# Patient Record
Sex: Female | Born: 1985 | Race: White | Hispanic: No | Marital: Married | Smoking: Former smoker
Health system: Southern US, Community
[De-identification: ages and names within clinical notes are randomized; demographics above are authoritative.]

## PROBLEM LIST (undated history)

## (undated) ENCOUNTER — Inpatient Hospital Stay (HOSPITAL_COMMUNITY): Payer: Self-pay

## (undated) DIAGNOSIS — F419 Anxiety disorder, unspecified: Secondary | ICD-10-CM

## (undated) DIAGNOSIS — Z9089 Acquired absence of other organs: Secondary | ICD-10-CM

## (undated) DIAGNOSIS — R112 Nausea with vomiting, unspecified: Secondary | ICD-10-CM

## (undated) DIAGNOSIS — Z8659 Personal history of other mental and behavioral disorders: Secondary | ICD-10-CM

## (undated) DIAGNOSIS — F329 Major depressive disorder, single episode, unspecified: Secondary | ICD-10-CM

## (undated) DIAGNOSIS — Z87891 Personal history of nicotine dependence: Secondary | ICD-10-CM

## (undated) DIAGNOSIS — D649 Anemia, unspecified: Secondary | ICD-10-CM

## (undated) DIAGNOSIS — E039 Hypothyroidism, unspecified: Secondary | ICD-10-CM

## (undated) DIAGNOSIS — O26899 Other specified pregnancy related conditions, unspecified trimester: Secondary | ICD-10-CM

## (undated) DIAGNOSIS — F319 Bipolar disorder, unspecified: Secondary | ICD-10-CM

## (undated) DIAGNOSIS — R102 Pelvic and perineal pain: Secondary | ICD-10-CM

## (undated) DIAGNOSIS — J189 Pneumonia, unspecified organism: Secondary | ICD-10-CM

## (undated) DIAGNOSIS — Z8279 Family history of other congenital malformations, deformations and chromosomal abnormalities: Secondary | ICD-10-CM

## (undated) DIAGNOSIS — K589 Irritable bowel syndrome without diarrhea: Secondary | ICD-10-CM

## (undated) DIAGNOSIS — IMO0002 Reserved for concepts with insufficient information to code with codable children: Secondary | ICD-10-CM

## (undated) DIAGNOSIS — F32A Depression, unspecified: Secondary | ICD-10-CM

## (undated) DIAGNOSIS — J45909 Unspecified asthma, uncomplicated: Secondary | ICD-10-CM

## (undated) DIAGNOSIS — Z9889 Other specified postprocedural states: Secondary | ICD-10-CM

## (undated) HISTORY — PX: TONSILLECTOMY: SUR1361

## (undated) HISTORY — PX: WISDOM TOOTH EXTRACTION: SHX21

## (undated) HISTORY — PX: ADENOIDECTOMY: SUR15

## (undated) HISTORY — DX: Depression, unspecified: F32.A

## (undated) HISTORY — DX: Major depressive disorder, single episode, unspecified: F32.9

---

## 2013-07-27 ENCOUNTER — Emergency Department (HOSPITAL_BASED_OUTPATIENT_CLINIC_OR_DEPARTMENT_OTHER)
Admission: EM | Admit: 2013-07-27 | Discharge: 2013-07-27 | Disposition: A | Discharge status: Home / Self Care | Payer: BC Managed Care – PPO | Attending: Emergency Medicine | Admitting: Emergency Medicine

## 2013-07-27 ENCOUNTER — Encounter (HOSPITAL_BASED_OUTPATIENT_CLINIC_OR_DEPARTMENT_OTHER): Payer: Self-pay | Admitting: Emergency Medicine

## 2013-07-27 ENCOUNTER — Emergency Department (HOSPITAL_BASED_OUTPATIENT_CLINIC_OR_DEPARTMENT_OTHER): Payer: BC Managed Care – PPO

## 2013-07-27 DIAGNOSIS — R51 Headache: Secondary | ICD-10-CM | POA: Insufficient documentation

## 2013-07-27 DIAGNOSIS — Z3202 Encounter for pregnancy test, result negative: Secondary | ICD-10-CM | POA: Insufficient documentation

## 2013-07-27 DIAGNOSIS — Z79899 Other long term (current) drug therapy: Secondary | ICD-10-CM | POA: Insufficient documentation

## 2013-07-27 DIAGNOSIS — R55 Syncope and collapse: Secondary | ICD-10-CM

## 2013-07-27 DIAGNOSIS — F17200 Nicotine dependence, unspecified, uncomplicated: Secondary | ICD-10-CM | POA: Insufficient documentation

## 2013-07-27 DIAGNOSIS — R110 Nausea: Secondary | ICD-10-CM | POA: Insufficient documentation

## 2013-07-27 DIAGNOSIS — R197 Diarrhea, unspecified: Secondary | ICD-10-CM | POA: Insufficient documentation

## 2013-07-27 DIAGNOSIS — H9209 Otalgia, unspecified ear: Secondary | ICD-10-CM | POA: Insufficient documentation

## 2013-07-27 DIAGNOSIS — F319 Bipolar disorder, unspecified: Secondary | ICD-10-CM | POA: Insufficient documentation

## 2013-07-27 HISTORY — DX: Bipolar disorder, unspecified: F31.9

## 2013-07-27 LAB — CBC WITH DIFFERENTIAL/PLATELET
Eosinophils Absolute: 0.1 10*3/uL (ref 0.0–0.7)
Eosinophils Relative: 1 % (ref 0–5)
Hemoglobin: 12.6 g/dL (ref 12.0–15.0)
Lymphs Abs: 3.6 10*3/uL (ref 0.7–4.0)
MCH: 30.4 pg (ref 26.0–34.0)
MCV: 90.6 fL (ref 78.0–100.0)
Monocytes Absolute: 1 10*3/uL (ref 0.1–1.0)
Monocytes Relative: 9 % (ref 3–12)
Platelets: 312 10*3/uL (ref 150–400)
RBC: 4.14 MIL/uL (ref 3.87–5.11)
RDW: 12.8 % (ref 11.5–15.5)

## 2013-07-27 LAB — BASIC METABOLIC PANEL
BUN: 10 mg/dL (ref 6–23)
Calcium: 9.8 mg/dL (ref 8.4–10.5)
Creatinine, Ser: 0.7 mg/dL (ref 0.50–1.10)
GFR calc non Af Amer: >90 mL/min (ref >90)
Glucose, Bld: 111 mg/dL — ABNORMAL HIGH (ref 70–99)
Sodium: 139 mEq/L (ref 135–145)

## 2013-07-27 LAB — URINALYSIS, ROUTINE W REFLEX MICROSCOPIC
Bilirubin Urine: NEGATIVE
Ketones, ur: NEGATIVE mg/dL
Nitrite: NEGATIVE
pH: 7 (ref 5.0–8.0)

## 2013-07-27 MED ORDER — SODIUM CHLORIDE 0.9 % IV SOLN
1000.0000 mL | Freq: Once | INTRAVENOUS | Status: AC
  Administered 2013-07-27: 1000 mL via INTRAVENOUS

## 2013-07-27 MED ORDER — SODIUM CHLORIDE 0.9 % IV SOLN
1000.0000 mL | INTRAVENOUS | Status: DC
  Administered 2013-07-27: 1000 mL via INTRAVENOUS

## 2013-07-27 NOTE — ED Provider Notes (Signed)
CSN: [id number]     Arrival date & time 07/27/13  4098 History  This chart was scribed for Celene Kras, MD by Danella Maiers, ED Scribe. This patient was seen in room MH02/MH02 and the patient's care was started at 6:48 PM.    Chief Complaint  Patient presents with  . Near Syncope   The history is provided by the patient. No language interpreter was used.   HPI Comments: Karen Yates is a 27 y.o. female who presents to the Emergency Department complaining of multiple episodes of syncope intermittently since 4pm today. She states she is able to stop LOC by sitting down, but every time she tries to stand up it brings the syncope on. She states one episode she was out for 60 seconds. She states when she comes to she is very emotional and tearful. She reports nausea, diarrhea. She has had a headache the past four days, taking ibuprofen with no relief. She has a h/o headaches and is usually able to control with ibuprofen. She also has bruises to bilateral upper arms. She states she had ear pain two weeks ago. She denies hitting her head, injuries. She denies blood in her stool. She is on Mirena and does not get periods.  Past Medical History  Diagnosis Date  . Bipolar 1 disorder    Past Surgical History  Procedure Laterality Date  . Cesarean section    . Tonsillectomy     No family history on file. History  Substance Use Topics  . Smoking status: Current Some Day Smoker  . Smokeless tobacco: Not on file  . Alcohol Use: Yes   OB History   Grav Para Term Preterm Abortions TAB SAB Ect Mult Living                 Review of Systems  Gastrointestinal: Positive for nausea and diarrhea. Negative for vomiting and blood in stool.  Neurological: Positive for syncope and headaches.  All other systems reviewed and are negative.   A complete 10 system review of systems was obtained and all systems are negative except as noted in the HPI and PMH.   Allergies  Review of patient's allergies  indicates no known allergies.  Home Medications   Current Outpatient Rx  Name  Route  Sig  Dispense  Refill  . BusPIRone HCl (BUSPAR PO)   Oral   Take by mouth.         . DULoxetine (CYMBALTA) 60 MG capsule   Oral   Take 60 mg by mouth daily.         . Topiramate (TOPAMAX PO)   Oral   Take by mouth.          BP 125/77  Pulse 88  Temp(Src) 98.6 F (37 C) (Oral)  Resp 18  Ht 5\' 6"  (1.676 m)  Wt 200 lb (90.719 kg)  BMI 32.30 kg/m2  SpO2 100% Physical Exam  Nursing note and vitals reviewed. Constitutional: She is oriented to person, place, and time. She appears well-developed and well-nourished. No distress.  HENT:  Head: Normocephalic and atraumatic.  Right Ear: External ear normal.  Left Ear: External ear normal.  Mouth/Throat: Oropharynx is clear and moist.  Normal TMs  Eyes: Conjunctivae are normal. Right eye exhibits no discharge. Left eye exhibits no discharge. No scleral icterus.  Neck: Neck supple. No tracheal deviation present.  Cardiovascular: Normal rate, regular rhythm and intact distal pulses.   Pulmonary/Chest: Effort normal and breath sounds normal. No stridor.  No respiratory distress. She has no wheezes. She has no rales.  Abdominal: Soft. Bowel sounds are normal. She exhibits no distension. There is no tenderness. There is no rebound and no guarding.  Musculoskeletal: She exhibits no edema and no tenderness.  No C-, T-, or L-spine tenderness  Neurological: She is alert and oriented to person, place, and time. She has normal strength. No cranial nerve deficit (No facial droop, extraocular movements intact, tongue midline ) or sensory deficit. She exhibits normal muscle tone. She displays no seizure activity. Coordination normal.  No pronator drift bilateral upper extrem, able to hold both legs off bed for 5 seconds, sensation intact in all extremities, no visual field cuts, no left or right sided neglect, normal finger-nose exam bilaterally, no nystagmus  noted   Skin: Skin is warm and dry. No rash noted.  Psychiatric: She has a normal mood and affect.    ED Course  Procedures (including critical care time) Medications  0.9 %  sodium chloride infusion (1,000 mLs Intravenous New Bag/Given 07/27/13 1947)    Followed by  0.9 %  sodium chloride infusion (1,000 mLs Intravenous New Bag/Given 07/27/13 1948)    Followed by  0.9 %  sodium chloride infusion (1,000 mLs Intravenous New Bag/Given 07/27/13 1948)    DIAGNOSTIC STUDIES: Oxygen Saturation is 100% on RA, normal by my interpretation.    COORDINATION OF CARE: 6:57 PM- Discussed treatment plan with pt which includes bloodwork, UA. Pt agrees to plan.    Labs Review Labs Reviewed  CBC WITH DIFFERENTIAL - Abnormal; Notable for the following:    WBC 12.1 (*)    All other components within normal limits  BASIC METABOLIC PANEL - Abnormal; Notable for the following:    Glucose, Bld 111 (*)    All other components within normal limits  URINALYSIS, ROUTINE W REFLEX MICROSCOPIC  PREGNANCY, URINE   Imaging Review Ct Head Wo Contrast  07/27/2013   CLINICAL DATA:  Dizziness.  Headache.  EXAM: CT HEAD WITHOUT CONTRAST  TECHNIQUE: Contiguous axial images were obtained from the base of the skull through the vertex without intravenous contrast.  COMPARISON:  None.  FINDINGS: Normal appearing cerebral hemispheres and posterior fossa structures. Normal size and position of the ventricles. No intracranial hemorrhage, mass lesion or CT evidence of acute infarction. Unremarkable bones and included paranasal sinuses.  IMPRESSION: Normal examination.   Electronically Signed   By: Gordan Payment M.D.   On: 07/27/2013 19:40    EKG Interpretation     Ventricular Rate:  77 PR Interval:  106 QRS Duration: 80 QT Interval:  364 QTC Calculation: 411 R Axis:   40 Text Interpretation:  Sinus rhythm with short PR no delta wave Abnormal ECG No previous tracing            MDM   1. Syncope    EKG  not suggestive of WPW.  Likely vasovagal.  Pt has been able to walk to the bathroom in the ED without difficulty.  She mentions history of IBS and she has had some diarrhea.  She may have been dehydrated.    At this time there does not appear to be any evidence of an acute emergency medical condition and the patient appears stable for discharge with appropriate outpatient follow up.      I personally performed the services described in this documentation, which was scribed in my presence.  The recorded information has been reviewed and is accurate.   Celene Kras, MD 07/27/13 2049

## 2013-07-27 NOTE — ED Notes (Signed)
Dizziness since this afternoon. States she has been passing out everytime she tries to walk around. States when she wakes up she is real emotional and crying. Headache x 4 days. Alert oriented.

## 2013-08-12 ENCOUNTER — Emergency Department (HOSPITAL_BASED_OUTPATIENT_CLINIC_OR_DEPARTMENT_OTHER)
Admission: EM | Admit: 2013-08-12 | Discharge: 2013-08-12 | Disposition: A | Discharge status: Home / Self Care | Payer: BC Managed Care – PPO | Attending: Emergency Medicine | Admitting: Emergency Medicine

## 2013-08-12 ENCOUNTER — Encounter (HOSPITAL_BASED_OUTPATIENT_CLINIC_OR_DEPARTMENT_OTHER): Payer: Self-pay | Admitting: Emergency Medicine

## 2013-08-12 DIAGNOSIS — Z79899 Other long term (current) drug therapy: Secondary | ICD-10-CM | POA: Insufficient documentation

## 2013-08-12 DIAGNOSIS — F319 Bipolar disorder, unspecified: Secondary | ICD-10-CM | POA: Insufficient documentation

## 2013-08-12 DIAGNOSIS — T360X5A Adverse effect of penicillins, initial encounter: Secondary | ICD-10-CM | POA: Insufficient documentation

## 2013-08-12 DIAGNOSIS — T7840XA Allergy, unspecified, initial encounter: Secondary | ICD-10-CM

## 2013-08-12 DIAGNOSIS — L509 Urticaria, unspecified: Secondary | ICD-10-CM

## 2013-08-12 DIAGNOSIS — F17200 Nicotine dependence, unspecified, uncomplicated: Secondary | ICD-10-CM | POA: Insufficient documentation

## 2013-08-12 MED ORDER — EPINEPHRINE 0.3 MG/0.3ML IJ SOAJ
0.3000 mg | Freq: Once | INTRAMUSCULAR | Status: DC
  Filled 2013-08-12: qty 0.6

## 2013-08-12 MED ORDER — PREDNISONE 20 MG PO TABS: ORAL_TABLET | ORAL | Status: DC

## 2013-08-12 MED ORDER — FAMOTIDINE 20 MG PO TABS: 20.0000 mg | ORAL_TABLET | Freq: Two times a day (BID) | ORAL | Status: DC

## 2013-08-12 MED ORDER — FAMOTIDINE 20 MG PO TABS
20.0000 mg | ORAL_TABLET | Freq: Once | ORAL | Status: AC
  Administered 2013-08-12: 20 mg via ORAL
  Filled 2013-08-12: qty 1

## 2013-08-12 MED ORDER — DIPHENHYDRAMINE HCL 25 MG PO TABS: 50.0000 mg | ORAL_TABLET | ORAL | Status: DC | PRN

## 2013-08-12 MED ORDER — PREDNISONE 10 MG PO TABS
60.0000 mg | ORAL_TABLET | Freq: Once | ORAL | Status: AC
  Administered 2013-08-12: 60 mg via ORAL
  Filled 2013-08-12 (×2): qty 1

## 2013-08-12 MED ORDER — EPINEPHRINE HCL 1 MG/ML IJ SOLN
INTRAMUSCULAR | Status: AC
  Administered 2013-08-12: 0.3 mg
  Filled 2013-08-12: qty 1

## 2013-08-12 MED ORDER — DIPHENHYDRAMINE HCL 50 MG/ML IJ SOLN
50.0000 mg | Freq: Once | INTRAMUSCULAR | Status: AC
  Administered 2013-08-12: 50 mg via INTRAMUSCULAR
  Filled 2013-08-12: qty 1

## 2013-08-12 MED ORDER — LORATADINE 10 MG PO TABS: 10.0000 mg | ORAL_TABLET | Freq: Every day | ORAL | Status: DC

## 2013-08-12 NOTE — ED Provider Notes (Signed)
CSN: [id number]     Arrival date & time 08/12/13  1032 History   First MD Initiated Contact with Patient 08/12/13 1053     Chief Complaint  Patient presents with  . allergic reaction    (Consider location/radiation/quality/duration/timing/severity/associated sxs/prior Treatment) HPI This 27 year old female presents with a rash, she has had about a week or so of some nasal congestion and was started on amoxicillin by an urgent care, yesterday she had mild itching and redness to her thighs but today has developed generalized hives with generalized itching with no involvement of her eyes mouth her vaginal region, she is no tongue swelling no lip swelling no shortness of breath no chest pain no vomiting no abdominal pain no lightheadedness no confusion, treatment prior to arrival consisted of Benadryl orally just prior to arrival. She is no bleeding or blistering or bruising rash. Her itching and rash are moderately severe. Past Medical History  Diagnosis Date  . Bipolar 1 disorder    Past Surgical History  Procedure Laterality Date  . Cesarean section    . Tonsillectomy     History reviewed. No pertinent family history. History  Substance Use Topics  . Smoking status: Current Some Day Smoker  . Smokeless tobacco: Not on file  . Alcohol Use: Yes   OB History   Grav Para Term Preterm Abortions TAB SAB Ect Mult Living                 Review of Systems 10 Systems reviewed and are negative for acute change except as noted in the HPI. Allergies  Review of patient's allergies indicates no known allergies.  Home Medications   Current Outpatient Rx  Name  Route  Sig  Dispense  Refill  . BusPIRone HCl (BUSPAR PO)   Oral   Take by mouth.         . diphenhydrAMINE (BENADRYL) 25 MG tablet   Oral   Take 2 tablets (50 mg total) by mouth every 4 (four) hours as needed for itching.   20 tablet   0   . DULoxetine (CYMBALTA) 60 MG capsule   Oral   Take 60 mg by mouth daily.         . famotidine (PEPCID) 20 MG tablet   Oral   Take 1 tablet (20 mg total) by mouth 2 (two) times daily.   10 tablet   0   . loratadine (CLARITIN) 10 MG tablet   Oral   Take 1 tablet (10 mg total) by mouth daily. One po daily x 5 days   5 tablet   0   . predniSONE (DELTASONE) 20 MG tablet      2 tabs po daily x 4 days   8 tablet   0   . Topiramate (TOPAMAX PO)   Oral   Take by mouth.          BP 115/66  Pulse 82  Temp(Src) 98.6 F (37 C) (Oral)  Resp 18  SpO2 100% Physical Exam  Nursing note and vitals reviewed. Constitutional:  Awake, alert, nontoxic appearance.  HENT:  Head: Atraumatic.  Tongue appears normal  Eyes: Right eye exhibits no discharge. Left eye exhibits no discharge.  Neck: Neck supple.  Cardiovascular: Normal rate and regular rhythm.   No murmur heard. Pulmonary/Chest: Effort normal and breath sounds normal. No respiratory distress. She has no wheezes. She has no rales. She exhibits no tenderness.  Abdominal: Soft. There is no tenderness. There is no rebound.  Musculoskeletal:  She exhibits no tenderness.  Baseline ROM, no obvious new focal weakness.  Neurological: She is alert.  Mental status and motor strength appears baseline for patient and situation.  Skin: Rash noted.  Diffuse urticarial rash on trunk and all 4 extremities with erythematous plaques no petechiae no purpura and no tenderness no vesicles  Psychiatric: She has a normal mood and affect.    ED Course  Procedures (including critical care time) Pt feels improved after observation and/or treatment in ED.Patient / Family / Caregiver informed of clinical course, understand medical decision-making process, and agree with plan. Labs Review Labs Reviewed - No data to display Imaging Review No results found.  EKG Interpretation   None       MDM   1. Hives   2. Allergic reaction caused by a drug    I doubt any other EMC precluding discharge at this time including, but not  necessarily limited to the following:anaphylaxis.    Hurman Horn, MD 08/12/13 2045

## 2013-08-12 NOTE — ED Notes (Signed)
Yesterday had red itchy and burning rash on her legs today has spread to chest hands and trunk. Pt denies using any thing new but does report being on amoxicillin x one week for a sinus infection

## 2013-12-04 ENCOUNTER — Emergency Department (HOSPITAL_BASED_OUTPATIENT_CLINIC_OR_DEPARTMENT_OTHER): Payer: BC Managed Care – PPO

## 2013-12-04 ENCOUNTER — Emergency Department (HOSPITAL_BASED_OUTPATIENT_CLINIC_OR_DEPARTMENT_OTHER)
Admission: EM | Admit: 2013-12-04 | Discharge: 2013-12-04 | Disposition: A | Discharge status: Home / Self Care | Payer: BC Managed Care – PPO | Attending: Emergency Medicine | Admitting: Emergency Medicine

## 2013-12-04 ENCOUNTER — Encounter (HOSPITAL_BASED_OUTPATIENT_CLINIC_OR_DEPARTMENT_OTHER): Payer: Self-pay | Admitting: Emergency Medicine

## 2013-12-04 DIAGNOSIS — K5900 Constipation, unspecified: Secondary | ICD-10-CM | POA: Insufficient documentation

## 2013-12-04 DIAGNOSIS — F319 Bipolar disorder, unspecified: Secondary | ICD-10-CM | POA: Insufficient documentation

## 2013-12-04 DIAGNOSIS — Z79899 Other long term (current) drug therapy: Secondary | ICD-10-CM | POA: Insufficient documentation

## 2013-12-04 DIAGNOSIS — F17200 Nicotine dependence, unspecified, uncomplicated: Secondary | ICD-10-CM | POA: Insufficient documentation

## 2013-12-04 DIAGNOSIS — Z880 Allergy status to penicillin: Secondary | ICD-10-CM | POA: Insufficient documentation

## 2013-12-04 HISTORY — DX: Irritable bowel syndrome, unspecified: K58.9

## 2013-12-04 MED ORDER — DISPOSABLE ENEMA 19-7 GM/118ML RE ENEM: 1.0000 | ENEMA | Freq: Once | RECTAL | Status: DC

## 2013-12-04 MED ORDER — MAGNESIUM CITRATE PO SOLN: 1.0000 | Freq: Once | ORAL | Status: DC

## 2013-12-04 NOTE — Discharge Instructions (Signed)
Constipation, Adult °Constipation is when a person: °· Poops (bowel movement) less than 3 times a week. °· Has a hard time pooping. °· Has poop that is dry, hard, or bigger than normal. °HOME CARE  °· Eat more fiber, such as fruits, vegetables, whole grains like brown rice, and beans. °· Eat less fatty foods and sugar. This includes French fries, hamburgers, cookies, candy, and soda. °· If you are not getting enough fiber from food, take products with added fiber in them (supplements). °· Drink enough fluid to keep your pee (urine) clear or pale yellow. °· Go to the restroom when you feel like you need to poop. Do not hold it. °· Only take medicine as told by your doctor. Do not take medicines that help you poop (laxatives) without talking to your doctor first. °· Exercise on a regular basis, or as told by your doctor. °GET HELP RIGHT AWAY IF:  °· You have bright red blood in your poop (stool). °· Your constipation lasts more than 4 days or gets worse. °· You have belly (abdomen) or butt (rectal) pain. °· You have thin poop (as thin as a pencil). °· You lose weight, and it cannot be explained. °MAKE SURE YOU:  °· Understand these instructions. °· Will watch your condition. °· Will get help right away if you are not doing well or get worse. °Document Released: 02/19/2008 Document Revised: 11/25/2011 Document Reviewed: 06/14/2013 °Exit[employer]® Patient Information ©2014 [employer]. ° °Fiber Content in Foods °Drinking plenty of fluids and consuming foods high in fiber can help with constipation. See the list below for the fiber content of some common foods. °Starches and Grains / Dietary Fiber (g) °· Cheerios, 1 cup / 3 g °· Kellogg's Corn Flakes, 1 cup / 0.7 g °· Rice Krispies, 1 ¼ cup / 0.3 g °· Quaker Oat Life Cereal, ¾ cup / 2.1 g °· Oatmeal, instant (cooked), ½ cup / 2 g °· Kell[employer]'s Frosted Mini Wheats, 1 cup / 5.1 g °· Rice, brown, long-grain (cooked), 1 cup / 3.5 g °· Rice, white, long-grain (cooked), 1 cup /  0.6 g °· Macaroni, cooked, enriched, 1 cup / 2.5 g °Legumes / Dietary Fiber (g) °· Beans, baked, canned, plain or vegetarian, ½ cup / 5.2 g °· Beans, kidney, canned, ½ cup / 6.8 g °· Beans, pinto, dried (cooked), ½ cup / 7.7 g °· Beans, pinto, canned, ½ cup / 5.5 g °Breads and Crackers / Dietary Fiber (g) °· Graham crackers, plain or honey, 2 squares / 0.7 g °· Saltine crackers, 3 squares / 0.3 g °· Pretzels, plain, salted, 10 pieces / 1.8 g °· Bread, whole-wheat, 1 slice / 1.9 g °· Bread, white, 1 slice / 0.7 g °· Bread, raisin, 1 slice / 1.2 g °· Bagel, plain, 3 oz / 2 g °· Tortilla, flour, 1 oz / 0.9 g °· Tortilla, corn, 1 small / 1.5 g °· Bun, hamburger or hotdog, 1 small / 0.9 g °Fruits / Dietary Fiber (g) °· Apple, raw with skin, 1 medium / 4.4 g °· Applesauce, sweetened, ½ cup / 1.5 g °· Banana, ½ medium / 1.5 g °· Grapes, 10 grapes / 0.4 g °· Orange, 1 small / 2.3 g °· Raisin, 1.5 oz / 1.6 g °· Melon, 1 cup / 1.4 g °Vegetables / Dietary Fiber (g) °· Green beans, canned, ½ cup / 1.3 g °· Carrots (cooked), ½ cup / 2.3 g °· Broccoli (cooked), ½ cup / 2.8 g °· Peas,   frozen (cooked), ½ cup / 4.4 g °· Potatoes, mashed, ½ cup / 1.6 g °· Lettuce, 1 cup / 0.5 g °· Corn, canned, ½ cup / 1.6 g °· Tomato, ½ cup / 1.1 g °Document Released: 01/19/2007 Document Revised: 11/25/2011 Document Reviewed: 03/16/2007 °ExitCare® Patient Information ©2014 [employer]. ° °

## 2013-12-04 NOTE — ED Notes (Signed)
Dr Rosalia Hammersay at bedside to perform rectal exam with Jazmarie Biever RN as chaperone

## 2013-12-04 NOTE — ED Notes (Signed)
Reports having IBS-diarrhea 8 days ago, no BM since.  Passing mucus every two days or so.  Reports abdominal distention, decreased appetite.  Nausea x two days, abdominal cramping.  Denies fever.

## 2013-12-04 NOTE — ED Notes (Signed)
MD at bedside. 

## 2013-12-04 NOTE — ED Provider Notes (Signed)
CSN: [id number]     Arrival date & time 12/04/13  1433 History   This chart was scribed for Karen Quarryanielle S Galaxy Borden, MD by Blanchard KelchNicole Curnes, ED Scribe. The patient was seen in room MH06/MH06. Patient's care was started at 4:04 PM.      Chief Complaint  Patient presents with  . Constipation    Patient is a 28 y.o. female presenting with constipation. The history is provided by the patient. No language interpreter was used.  Constipation Time since last bowel movement:  8 days Timing:  Constant Progression:  Unchanged Chronicity:  New Context: not dehydration, not dietary changes and not medication   Stool description:  None produced Relieved by:  Nothing Ineffective treatments:  Defecation, fiber and stool softeners Associated symptoms: no fever     HPI Comments: Karen Yates is a 28 y.o. female with a history of IBS who presents to the Emergency Department complaining of constant constipation for eight days. She states she has had one episode of diarrhea eight days ago and has not had a BM since. She states that she feels as if there is stool in the rectum but she is unable to push it out. She has associated abdominal distension as well as mucus and blood on the tissue paper when she wipes. She has been taking an OTC stool softener, drinking water, eating dairy and fiber without relief. She is on Cymbalta and Buspar for Bipolar disorder but has been on the medications for over a year. She denies a history of constipation.  Pt denies having a PCP or GI specialist currently.   Past Medical History  Diagnosis Date  . Bipolar 1 disorder   . IBS (irritable bowel syndrome)    Past Surgical History  Procedure Laterality Date  . Cesarean section    . Tonsillectomy     No family history on file. History  Substance Use Topics  . Smoking status: Current Some Day Smoker  . Smokeless tobacco: Not on file  . Alcohol Use: Yes   OB History   Grav Para Term Preterm Abortions TAB SAB Ect Mult Living                  Review of Systems  Constitutional: Negative for fever.  Gastrointestinal: Positive for constipation and abdominal distention.  All other systems reviewed and are negative.      Allergies  Amoxicillin  Home Medications   Current Outpatient Rx  Name  Route  Sig  Dispense  Refill  . BusPIRone HCl (BUSPAR PO)   Oral   Take by mouth.         . diphenhydrAMINE (BENADRYL) 25 MG tablet   Oral   Take 2 tablets (50 mg total) by mouth every 4 (four) hours as needed for itching.   20 tablet   0   . DULoxetine (CYMBALTA) 60 MG capsule   Oral   Take 60 mg by mouth daily.         . famotidine (PEPCID) 20 MG tablet   Oral   Take 1 tablet (20 mg total) by mouth 2 (two) times daily.   10 tablet   0   . loratadine (CLARITIN) 10 MG tablet   Oral   Take 1 tablet (10 mg total) by mouth daily. One po daily x 5 days   5 tablet   0   . predniSONE (DELTASONE) 20 MG tablet      2 tabs po daily x 4 days   8  tablet   0   . Topiramate (TOPAMAX PO)   Oral   Take by mouth.          Triage Vitals: BP 135/88  Pulse 113  Temp(Src) 98.1 F (36.7 C) (Oral)  Resp 16  SpO2 98%  Physical Exam  Nursing note and vitals reviewed. Constitutional: She is oriented to person, place, and time. She appears well-developed and well-nourished.  HENT:  Head: Normocephalic and atraumatic.  Eyes: Conjunctivae and EOM are normal. Pupils are equal, round, and reactive to light.  Neck: Normal range of motion. Neck supple.  Cardiovascular: Normal rate, regular rhythm and normal heart sounds.   Pulmonary/Chest: Effort normal and breath sounds normal. No respiratory distress. She has no wheezes. She has no rales. She exhibits no tenderness.  Abdominal: Soft. Bowel sounds are normal. There is no tenderness. There is no rebound and no guarding.  Musculoskeletal: Normal range of motion. She exhibits no edema.  Lymphadenopathy:    She has no cervical adenopathy.  Neurological: She  is alert and oriented to person, place, and time.  Skin: Skin is warm and dry. No rash noted.  Psychiatric: She has a normal mood and affect.    ED Course  Procedures (including critical care time)  DIAGNOSTIC STUDIES: Oxygen Saturation is 98% on room air, normal by my interpretation.    COORDINATION OF CARE: 4:10 PM - Patient verbalizes understanding and agrees with treatment plan.    Labs Review Labs Reviewed - No data to display Imaging Review Dg Abd 1 View  12/04/2013   CLINICAL DATA:  Diarrhea 8 days ago, no bowel movement since, abdominal distension, decreased appetite, nausea for 2 days, cramping, history irritable bowel syndrome  EXAM: ABDOMEN - 1 VIEW  COMPARISON:  None  FINDINGS: IUD projects over pelvis.  Tiny pelvic phleboliths.  No urinary tract calcification.  Normal bowel gas pattern with scattered gas within nondistended margin small bowel loops.  No bowel wall thickening or acute osseous findings.  IMPRESSION: Normal bowel gas pattern.   Electronically Signed   By: Ulyses Southward M.D.   On: 12/04/2013 16:51     EKG Interpretation None      MDM   Final diagnoses:  None   27 rolled female complaining of constipation. There are plain x-rays obtained that showed no evidence of obstruction. Rectal exam reveals no impaction. Plan Fleet Enema and stool softeners increase in fiber with followup with primary care physician. I personally performed the services described in this documentation, which was scribed in my presence. The recorded information has been reviewed and considered.    Karen Quarry, MD 12/04/13 [phone]

## 2013-12-29 ENCOUNTER — Emergency Department (HOSPITAL_BASED_OUTPATIENT_CLINIC_OR_DEPARTMENT_OTHER)
Admission: EM | Admit: 2013-12-29 | Discharge: 2013-12-29 | Disposition: A | Discharge status: Home / Self Care | Payer: BC Managed Care – PPO | Attending: Emergency Medicine | Admitting: Emergency Medicine

## 2013-12-29 ENCOUNTER — Encounter (HOSPITAL_BASED_OUTPATIENT_CLINIC_OR_DEPARTMENT_OTHER): Payer: Self-pay | Admitting: Emergency Medicine

## 2013-12-29 ENCOUNTER — Emergency Department (HOSPITAL_BASED_OUTPATIENT_CLINIC_OR_DEPARTMENT_OTHER): Payer: BC Managed Care – PPO

## 2013-12-29 DIAGNOSIS — R110 Nausea: Secondary | ICD-10-CM | POA: Insufficient documentation

## 2013-12-29 DIAGNOSIS — J40 Bronchitis, not specified as acute or chronic: Secondary | ICD-10-CM

## 2013-12-29 DIAGNOSIS — R5383 Other fatigue: Secondary | ICD-10-CM | POA: Insufficient documentation

## 2013-12-29 DIAGNOSIS — M549 Dorsalgia, unspecified: Secondary | ICD-10-CM | POA: Insufficient documentation

## 2013-12-29 DIAGNOSIS — Z79899 Other long term (current) drug therapy: Secondary | ICD-10-CM | POA: Insufficient documentation

## 2013-12-29 DIAGNOSIS — Z880 Allergy status to penicillin: Secondary | ICD-10-CM | POA: Insufficient documentation

## 2013-12-29 DIAGNOSIS — Z8719 Personal history of other diseases of the digestive system: Secondary | ICD-10-CM | POA: Insufficient documentation

## 2013-12-29 DIAGNOSIS — R42 Dizziness and giddiness: Secondary | ICD-10-CM | POA: Insufficient documentation

## 2013-12-29 DIAGNOSIS — Z87891 Personal history of nicotine dependence: Secondary | ICD-10-CM | POA: Insufficient documentation

## 2013-12-29 DIAGNOSIS — J3489 Other specified disorders of nose and nasal sinuses: Secondary | ICD-10-CM | POA: Insufficient documentation

## 2013-12-29 DIAGNOSIS — F319 Bipolar disorder, unspecified: Secondary | ICD-10-CM | POA: Insufficient documentation

## 2013-12-29 DIAGNOSIS — R000 Tachycardia, unspecified: Secondary | ICD-10-CM | POA: Insufficient documentation

## 2013-12-29 DIAGNOSIS — R5381 Other malaise: Secondary | ICD-10-CM | POA: Insufficient documentation

## 2013-12-29 DIAGNOSIS — J45901 Unspecified asthma with (acute) exacerbation: Secondary | ICD-10-CM | POA: Insufficient documentation

## 2013-12-29 LAB — D-DIMER, QUANTITATIVE (NOT AT ARMC): D-Dimer, Quant: 0.32 ug/mL-FEU (ref 0.00–0.48)

## 2013-12-29 MED ORDER — AZITHROMYCIN 250 MG PO TABS: ORAL_TABLET | ORAL | Status: DC

## 2013-12-29 MED ORDER — IOHEXOL 350 MG/ML SOLN
100.0000 mL | Freq: Once | INTRAVENOUS | Status: AC | PRN
  Administered 2013-12-29: 100 mL via INTRAVENOUS

## 2013-12-29 MED ORDER — PREDNISONE 20 MG PO TABS: ORAL_TABLET | ORAL | Status: DC

## 2013-12-29 MED ORDER — ALBUTEROL SULFATE HFA 108 (90 BASE) MCG/ACT IN AERS
2.0000 | INHALATION_SPRAY | Freq: Once | RESPIRATORY_TRACT | Status: AC
  Administered 2013-12-29: 2 via RESPIRATORY_TRACT
  Filled 2013-12-29: qty 6.7

## 2013-12-29 NOTE — ED Provider Notes (Signed)
CSN: [id number]     Arrival date & time 12/29/13  1523 History   First MD Initiated Contact with Patient 12/29/13 1552     Chief Complaint  Patient presents with  . URI     (Consider location/radiation/quality/duration/timing/severity/associated sxs/prior Treatment) HPI Comments: Pt presents with worsening cough and SOB.  She has had cough and cold symptoms for 2 weeks.  She states that the upper respiratory congestion is gotten better however the cough is worsening. It's mostly a dry cough. She's had some increased fatigue and some nausea. She has shortness of breath on exertion. She also has some increased pain in her her chest and across her back. This is worse with breathing and worse with coughing. She denies any leg pain or swelling. She denies any recent fevers or chills. She has a history of asthma as a child but hasn't used inhaler number of years. She quit smoking a few months ago.  Patient is a 28 y.o. female presenting with URI.  URI Presenting symptoms: congestion, cough, fatigue and rhinorrhea   Presenting symptoms: no fever   Associated symptoms: no arthralgias, no headaches and no sneezing     Past Medical History  Diagnosis Date  . Bipolar 1 disorder   . IBS (irritable bowel syndrome)    Past Surgical History  Procedure Laterality Date  . Cesarean section    . Tonsillectomy     No family history on file. History  Substance Use Topics  . Smoking status: Former Games developer  . Smokeless tobacco: Not on file  . Alcohol Use: Yes   OB History   Grav Para Term Preterm Abortions TAB SAB Ect Mult Living                 Review of Systems  Constitutional: Positive for fatigue. Negative for fever, chills and diaphoresis.  HENT: Positive for congestion and rhinorrhea. Negative for sneezing.   Eyes: Negative.   Respiratory: Positive for cough and shortness of breath. Negative for chest tightness.   Cardiovascular: Positive for chest pain. Negative for leg swelling.   Gastrointestinal: Positive for nausea. Negative for vomiting, abdominal pain, diarrhea and blood in stool.  Genitourinary: Negative for frequency, hematuria, flank pain and difficulty urinating.  Musculoskeletal: Positive for back pain. Negative for arthralgias.  Skin: Negative for rash.  Neurological: Positive for light-headedness. Negative for dizziness, speech difficulty, weakness, numbness and headaches.      Allergies  Amoxicillin  Home Medications   Prior to Admission medications   Medication Sig Start Date End Date Taking? Authorizing Provider  BusPIRone HCl (BUSPAR PO) Take by mouth.    Historical Provider, MD  diphenhydrAMINE (BENADRYL) 25 MG tablet Take 2 tablets (50 mg total) by mouth every 4 (four) hours as needed for itching. 08/12/13   Hurman Horn, MD  DULoxetine (CYMBALTA) 60 MG capsule Take 60 mg by mouth daily.    Historical Provider, MD  famotidine (PEPCID) 20 MG tablet Take 1 tablet (20 mg total) by mouth 2 (two) times daily. 08/12/13   Hurman Horn, MD  loratadine (CLARITIN) 10 MG tablet Take 1 tablet (10 mg total) by mouth daily. One po daily x 5 days 08/12/13   Hurman Horn, MD  magnesium citrate SOLN Take 296 mLs (1 Bottle total) by mouth once. 12/04/13   Hilario Quarry, MD  predniSONE (DELTASONE) 20 MG tablet 2 tabs po daily x 4 days 08/12/13   Hurman Horn, MD  sodium phosphate (FLEET) enema Place 1 enema  rectally once. follow package directions 12/04/13   Hilario Quarryanielle S Ray, MD  Topiramate (TOPAMAX PO) Take by mouth.    Historical Provider, MD   BP 128/85  Pulse 86  Temp(Src) 98.2 F (36.8 C) (Oral)  Resp 18  Ht 5\' 6"  (1.676 m)  Wt 205 lb (92.987 kg)  BMI 33.10 kg/m2  SpO2 100% Physical Exam  Constitutional: She is oriented to person, place, and time. She appears well-developed and well-nourished.  HENT:  Head: Normocephalic and atraumatic.  Mouth/Throat: Oropharynx is clear and moist.  Eyes: Pupils are equal, round, and reactive to light.  Neck:  Normal range of motion. Neck supple.  Cardiovascular: Regular rhythm and normal heart sounds.  Tachycardia present.   Pulmonary/Chest: Effort normal and breath sounds normal. No respiratory distress. She has no wheezes. She has no rales. She exhibits no tenderness.  Abdominal: Soft. Bowel sounds are normal. There is no tenderness. There is no rebound and no guarding.  Musculoskeletal: Normal range of motion. She exhibits no edema.  No calf tenderness  Lymphadenopathy:    She has no cervical adenopathy.  Neurological: She is alert and oriented to person, place, and time.  Skin: Skin is warm and dry. No rash noted.  Psychiatric: She has a normal mood and affect.    ED Course  Procedures (including critical care time) Labs Review Labs Reviewed  D-DIMER, QUANTITATIVE    Imaging Review Dg Chest 2 View  12/29/2013   CLINICAL DATA:  Chest congestion  EXAM: CHEST  2 VIEW  COMPARISON:  None.  FINDINGS: Cardiomediastinal silhouette is unremarkable. No acute infiltrate or pleural effusion. No pulmonary edema. Bony thorax is unremarkable.  IMPRESSION: No active cardiopulmonary disease.   Electronically Signed   By: Natasha MeadLiviu  Pop M.D.   On: 12/29/2013 16:58   Ct Angio Chest Pe W/cm &/or Wo Cm  12/29/2013   CLINICAL DATA:  Chest pain and shortness of breath.  EXAM: CT ANGIOGRAPHY CHEST WITH CONTRAST  TECHNIQUE: Multidetector CT imaging of the chest was performed using the standard protocol during bolus administration of intravenous contrast. Multiplanar CT image reconstructions and MIPs were obtained to evaluate the vascular anatomy.  CONTRAST:  100mL OMNIPAQUE IOHEXOL 350 MG/ML SOLN  COMPARISON:  DG CHEST 2 VIEW dated 12/29/2013  FINDINGS: The pulmonary arteries are very well opacified. There is no evidence of pulmonary embolism. The thoracic aorta is also well opacified and shows normal caliber and patency.  The mediastinum is normal. The heart size is within normal limits. No pleural or pericardial fluid  is identified. Lungs show no evidence of infiltrates, pneumothorax, nodule or edema.  Visualized airways are normally patent. No evidence of lymphadenopathy. Visualized upper abdomen is unremarkable. Bony structures are within normal limits.  Review of the MIP images confirms the above findings.  IMPRESSION: Normal CTA of the chest.   Electronically Signed   By: Irish LackGlenn  Yamagata M.D.   On: 12/29/2013 18:17     EKG Interpretation None      MDM   Final diagnoses:  Bronchitis    Patient presents with a two-week history of cough and cold symptoms. Her oxygen saturations are normal but she was persistently tachycardic. Her chest x-ray was negative and given her persistent tachycardia with other symptoms, I did do a CT chest which was negative for PE or pneumonia. I feel that she likely has bronchitis. Since her symptoms have been worsening I did go ahead and give her prescription for Zithromax as well as prednisone. She did not have  a primary care physician. I encouraged her to either obtain a primary care sister for followup or return here as needed if she has no improvement of symptoms or any worsening symptoms.    Rolan BuccoMelanie Thaila Bottoms, MD 12/29/13 [phone]

## 2013-12-29 NOTE — ED Notes (Signed)
C/o head and chest congestion-nonprod cough x 4 days

## 2013-12-29 NOTE — Discharge Instructions (Signed)

## 2013-12-29 NOTE — ED Notes (Signed)
Family at bedside. 

## 2013-12-29 NOTE — ED Notes (Signed)
Patient ambulated in hall with pulse-ox on room air.  HR 118-132, RR 16-20, no DOE noted. SpO2 99-100%, quick steady pace.

## 2014-01-08 ENCOUNTER — Emergency Department (HOSPITAL_COMMUNITY)
Admission: EM | Admit: 2014-01-08 | Discharge: 2014-01-08 | Disposition: A | Discharge status: Home / Self Care | Payer: BC Managed Care – PPO | Attending: Emergency Medicine | Admitting: Emergency Medicine

## 2014-01-08 ENCOUNTER — Encounter (HOSPITAL_COMMUNITY): Payer: Self-pay | Admitting: Emergency Medicine

## 2014-01-08 ENCOUNTER — Emergency Department (HOSPITAL_COMMUNITY): Payer: BC Managed Care – PPO

## 2014-01-08 DIAGNOSIS — Z79899 Other long term (current) drug therapy: Secondary | ICD-10-CM | POA: Insufficient documentation

## 2014-01-08 DIAGNOSIS — G510 Bell's palsy: Secondary | ICD-10-CM | POA: Insufficient documentation

## 2014-01-08 DIAGNOSIS — Z880 Allergy status to penicillin: Secondary | ICD-10-CM | POA: Insufficient documentation

## 2014-01-08 DIAGNOSIS — Z8709 Personal history of other diseases of the respiratory system: Secondary | ICD-10-CM | POA: Insufficient documentation

## 2014-01-08 DIAGNOSIS — Z87891 Personal history of nicotine dependence: Secondary | ICD-10-CM | POA: Insufficient documentation

## 2014-01-08 DIAGNOSIS — IMO0002 Reserved for concepts with insufficient information to code with codable children: Secondary | ICD-10-CM | POA: Insufficient documentation

## 2014-01-08 DIAGNOSIS — Z8719 Personal history of other diseases of the digestive system: Secondary | ICD-10-CM | POA: Insufficient documentation

## 2014-01-08 DIAGNOSIS — F319 Bipolar disorder, unspecified: Secondary | ICD-10-CM | POA: Insufficient documentation

## 2014-01-08 DIAGNOSIS — F411 Generalized anxiety disorder: Secondary | ICD-10-CM | POA: Insufficient documentation

## 2014-01-08 DIAGNOSIS — R42 Dizziness and giddiness: Secondary | ICD-10-CM | POA: Insufficient documentation

## 2014-01-08 MED ORDER — VALACYCLOVIR HCL 1 G PO TABS: 1000.0000 mg | ORAL_TABLET | Freq: Three times a day (TID) | ORAL | Status: AC

## 2014-01-08 MED ORDER — PREDNISONE 20 MG PO TABS: ORAL_TABLET | ORAL | Status: DC

## 2014-01-08 MED ORDER — ARTIFICIAL TEARS OP OINT: TOPICAL_OINTMENT | Freq: Every evening | OPHTHALMIC | Status: DC | PRN

## 2014-01-08 NOTE — ED Notes (Signed)
Patient transported to CT 

## 2014-01-08 NOTE — Discharge Instructions (Signed)
Take the medications as prescribed. Use artificial tears in your left eye throughout the day to keep your eye moist, use the ointment at bedtime and tape your eye shut to prevent it from drying out. Call either of the neurologist's office on Monday, April 27th to get an appointment to be rechecked in about 2 weeks. Return to the ED if you get pain in your eye, fever, severe pain in your face.     Bell's Palsy Bell's palsy is a condition in which the muscles on one side of the face cannot move (paralysis). This is because the nerves in the face are paralyzed. It is most often thought to be caused by a virus. The virus causes swelling of the nerve that controls movement on one side of the face. The nerve travels through a tight space surrounded by bone. When the nerve swells, it can be compressed by the bone. This results in damage to the protective covering around the nerve. This damage interferes with how the nerve communicates with the muscles of the face. As a result, it can cause weakness or paralysis of the facial muscles.  Injury (trauma), tumor, and surgery may cause Bell's palsy, but most of the time the cause is unknown. It is a relatively common condition. It starts suddenly (abrupt onset) with the paralysis usually ending within 2 days. Bell's palsy is not dangerous. But because the eye does not close properly, you may need care to keep the eye from getting dry. This can include splinting (to keep the eye shut) or moistening with artificial tears. Bell's palsy very seldom occurs on both sides of the face at the same time. SYMPTOMS   Eyebrow sagging.  Drooping of the eyelid and corner of the mouth.  Inability to close one eye.  Loss of taste on the front of the tongue.  Sensitivity to loud noises. TREATMENT  The treatment is usually non-surgical. If the patient is seen within the first 24 to 48 hours, a short course of steroids may be prescribed, in an attempt to shorten the length of the  condition. Antiviral medicines may also be used with the steroids, but it is unclear if they are helpful.  You will need to protect your eye, if you cannot close it. The cornea (clear covering over your eye) will become dry and can be damaged. Artificial tears can be used to keep your eye moist. Glasses or an eye patch should be worn to protect your eye. PROGNOSIS  Recovery is variable, ranging from days to months. Although the problem usually goes away completely (about 80% of cases resolve), predicting the outcome is impossible. Most people improve within 3 weeks of when the symptoms began. Improvement may continue for 3 to 6 months. A small number of people have moderate to severe weakness that is permanent.  HOME CARE INSTRUCTIONS   If your caregiver prescribed medication to reduce swelling in the nerve, use as directed. Do not stop taking the medication unless directed by your caregiver.  Use moisturizing eye drops as needed to prevent drying of your eye, as directed by your caregiver.  Protect your eye, as directed by your caregiver.  Use facial massage and exercises, as directed by your caregiver.  Perform your normal activities, and get your normal rest. SEEK IMMEDIATE MEDICAL CARE IF:   There is pain, redness or irritation in the eye.  You or your child has an oral temperature above 102 F (38.9 C), not controlled by medicine. MAKE SURE YOU:  Understand these instructions.  Will watch your condition.  Will get help right away if you are not doing well or get worse. Document Released: 09/02/2005 Document Revised: 11/25/2011 Document Reviewed: 09/11/2009 [facility] Patient Information 2014 [place], [place].

## 2014-01-08 NOTE — ED Notes (Signed)
Pt from home reports that she has had , nausea, dizziness all day, was getting ready to go to store approx 30 minutes PTA, when she had sudden onset L sided facial numbness. Pt states that she had difficulty speaking at that time and could not get the word out. Pt has droop to L side of moth. Pt is A&O and in NAD

## 2014-01-08 NOTE — ED Provider Notes (Signed)
CSN: [id number]     Arrival date & time 01/08/14  1619 History   First MD Initiated Contact with Patient 01/08/14 1640     Chief Complaint  Patient presents with  . Numbness  . Dizziness  . Facial Droop     (Consider location/radiation/quality/duration/timing/severity/associated sxs/prior Treatment) HPI Patient reports she has been ill off the last few months with dizziness and falling felt to be a sinus infection, and then bronchitis 2 weeks ago. She states today she felt dizzy when she woke up and was having pain in the left side of her head around her ear. She states the sound of dishes clanging together makes the pain worse. She also states she noted today that her left face feels numb. She states she's having trouble saying her words because her mouth doesn't work right. She just states she is trying to work her mouth to make the numbness go away. She denies trouble swallowing or breathing. She denies having liquid or food fall out of her mouth when she eats. She denies any numbness in the rest of her body but does describe tingling in both of her hands and her feet that she is relating to anxiety. She states she has a Mirena in for the past 3 years. She states she's never had trouble like this before. She denies any recent tick exposure. She actually states she's never had a tick on herself.  Family history she had an uncle who had 2 strokes in his 30s of uncertain etiology. She states her maternal grandfather has trouble speaking and communicating.  PCP None  Past Medical History  Diagnosis Date  . Bipolar 1 disorder   . IBS (irritable bowel syndrome)   anxiety   Past Surgical History  Procedure Laterality Date  . Cesarean section    . Tonsillectomy     No family history on file. History  Substance Use Topics  . Smoking status: Former Games developer  . Smokeless tobacco: Not on file  . 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     Allergies  Amoxicillin  Home Medications   Prior to Admission medications   Medication Sig Start Date End Date Taking? Authorizing Provider  albuterol (PROVENTIL HFA;VENTOLIN HFA) 108 (90 BASE) MCG/ACT inhaler Inhale 2 puffs into the lungs every 6 (six) hours as needed for wheezing or shortness of breath.   Yes Historical Provider, MD  busPIRone (BUSPAR) 10 MG tablet Take 10 mg by mouth 3 (three) times daily.   Yes Historical Provider, MD  DULoxetine (CYMBALTA) 60 MG capsule Take 60 mg by mouth daily.   Yes Historical Provider, MD  Topiramate (TOPAMAX PO) Take by mouth 3 (three) times daily.    Yes Historical Provider, MD  artificial tears (LACRILUBE) OINT ophthalmic ointment Place into the left eye at bedtime as needed for dry eyes. 01/08/14   Ward Givens, MD  predniSONE (DELTASONE) 20 MG tablet Take 3 po QD x 3d then 2 po QD x 3d then 1 po QD x 3d 01/08/14   Ward Givens, MD  valACYclovir (VALTREX) 1000 MG tablet Take 1 tablet (1,000 mg total) by mouth 3 (three) times daily. 01/08/14 01/22/14  Ward Givens, MD   BP 151/101  Pulse  128  Temp(Src) 98.6 F (37 C) (Oral)  Resp 20  SpO2 100%  Vital signs normal except hypertension and tachycardia (patient is very anxious  Physical Exam  Nursing note and vitals reviewed. Constitutional: She is oriented to person, place, and time. She appears well-developed and well-nourished.  Non-toxic appearance. She does not appear ill. No distress.  HENT:  Head: Normocephalic and atraumatic.  Right Ear: External ear normal.  Left Ear: External ear normal.  Nose: Nose normal. No mucosal edema or rhinorrhea.  Mouth/Throat: Oropharynx is clear and moist and mucous membranes are normal. No dental abscesses or uvula swelling.  Eyes: Conjunctivae and EOM are normal. Pupils are equal, round, and reactive to light.  Neck: Normal range of motion and full  passive range of motion without pain. Neck supple.  Cardiovascular: Normal rate, regular rhythm and normal heart sounds.  Exam reveals no gallop and no friction rub.   No murmur heard. Pulmonary/Chest: Effort normal and breath sounds normal. No respiratory distress. She has no wheezes. She has no rhonchi. She has no rales. She exhibits no tenderness and no crepitus.  Abdominal: Soft. Normal appearance and bowel sounds are normal. She exhibits no distension. There is no tenderness. There is no rebound and no guarding.  Musculoskeletal: Normal range of motion. She exhibits no edema and no tenderness.  Moves all extremities well.   Neurological: She is alert and oriented to person, place, and time. She has normal strength. No cranial nerve deficit.  Grips are equal, she has no focal motor deficit  Patient has asymmetrical smile with left facial droop. Her left eyelid is weak and she cannot resist me opening her eye. She still has symmetrical movement of her eyebrows however her symptoms just started today. She describes decreased sensation when I touch the left side of her face compared to the right to light touch.  Skin: Skin is warm, dry and intact. No rash noted. No erythema. No pallor.  Psychiatric: She has a normal mood and affect. Her speech is normal and behavior is normal. Her mood appears not anxious.    ED Course  Procedures (including critical care time)  Discussed expected course which should be complete resolution within the next 3-6 months. She was also devised small risk of permanent loss of function. We discussed using artificial tears in her left eye during the day and to use ointment in her eye at night to prevent drying of her cornea. She is going to followup with neurology.  Patient's tachycardia and hypertension is felt to be due to anxiety.  Labs Review Labs Reviewed - No data to display  Imaging Review Ct Head Wo Contrast  01/08/2014   CLINICAL DATA:  Code stroke,  dizziness, left facial droop  EXAM: CT HEAD WITHOUT CONTRAST  TECHNIQUE: Contiguous axial images were obtained from the base of the skull through the vertex without intravenous contrast.  COMPARISON:  07/27/2013  FINDINGS: No evidence of parenchymal hemorrhage or extra-axial fluid collection. No mass lesion, mass effect, or midline shift.  No CT evidence of acute infarction.  Cerebral volume is within normal limits.  No ventriculomegaly.  Mucous retention cyst in the left maxillary sinus. Visualized paranasal sinuses and mastoid air cells are otherwise clear.  No evidence of calvarial fracture.  IMPRESSION: Normal head CT.  These results were called by telephone at the time of interpretation on 01/08/2014 at 4:47 PM to Dr. Cathren LaineKEVIN STEINL , who verbally acknowledged these results.   Electronically Signed   By: Lurlean HornsSriyesh  Rito EhrlichKrishnan M.D.   On: 01/08/2014 16:47     EKG Interpretation None      MDM   Final diagnoses:  Bell's palsy    New Prescriptions   ARTIFICIAL TEARS (LACRILUBE) OINT OPHTHALMIC OINTMENT    Place into the left eye at bedtime as needed for dry eyes.   PREDNISONE (DELTASONE) 20 MG TABLET    Take 3 po QD x 3d then 2 po QD x 3d then 1 po QD x 3d   VALACYCLOVIR (VALTREX) 1000 MG TABLET    Take 1 tablet (1,000 mg total) by mouth 3 (three) times daily.    Plan discharge  Devoria AlbeIva Pellegrino Kennard, MD, Franz DellFACEP     Mailyn Steichen L Shanley Furlough, MD 01/08/14 [phone]

## 2014-06-18 ENCOUNTER — Encounter (HOSPITAL_BASED_OUTPATIENT_CLINIC_OR_DEPARTMENT_OTHER): Payer: Self-pay | Admitting: Emergency Medicine

## 2014-06-18 ENCOUNTER — Emergency Department (HOSPITAL_BASED_OUTPATIENT_CLINIC_OR_DEPARTMENT_OTHER)
Admission: EM | Admit: 2014-06-18 | Discharge: 2014-06-18 | Disposition: A | Discharge status: Home / Self Care | Payer: BC Managed Care – PPO | Attending: Emergency Medicine | Admitting: Emergency Medicine

## 2014-06-18 DIAGNOSIS — R197 Diarrhea, unspecified: Secondary | ICD-10-CM | POA: Insufficient documentation

## 2014-06-18 DIAGNOSIS — Z79899 Other long term (current) drug therapy: Secondary | ICD-10-CM | POA: Diagnosis not present

## 2014-06-18 DIAGNOSIS — R000 Tachycardia, unspecified: Secondary | ICD-10-CM | POA: Insufficient documentation

## 2014-06-18 DIAGNOSIS — Z3202 Encounter for pregnancy test, result negative: Secondary | ICD-10-CM | POA: Diagnosis not present

## 2014-06-18 DIAGNOSIS — Z8719 Personal history of other diseases of the digestive system: Secondary | ICD-10-CM | POA: Diagnosis not present

## 2014-06-18 DIAGNOSIS — Z87891 Personal history of nicotine dependence: Secondary | ICD-10-CM | POA: Insufficient documentation

## 2014-06-18 DIAGNOSIS — Z7952 Long term (current) use of systemic steroids: Secondary | ICD-10-CM | POA: Insufficient documentation

## 2014-06-18 DIAGNOSIS — Z880 Allergy status to penicillin: Secondary | ICD-10-CM | POA: Diagnosis not present

## 2014-06-18 DIAGNOSIS — R51 Headache: Secondary | ICD-10-CM | POA: Diagnosis not present

## 2014-06-18 DIAGNOSIS — R6883 Chills (without fever): Secondary | ICD-10-CM | POA: Diagnosis not present

## 2014-06-18 DIAGNOSIS — R112 Nausea with vomiting, unspecified: Secondary | ICD-10-CM | POA: Insufficient documentation

## 2014-06-18 DIAGNOSIS — M791 Myalgia: Secondary | ICD-10-CM | POA: Insufficient documentation

## 2014-06-18 DIAGNOSIS — F319 Bipolar disorder, unspecified: Secondary | ICD-10-CM | POA: Diagnosis not present

## 2014-06-18 LAB — URINALYSIS, ROUTINE W REFLEX MICROSCOPIC
Bilirubin Urine: NEGATIVE
GLUCOSE, UA: NEGATIVE mg/dL
Hgb urine dipstick: NEGATIVE
Ketones, ur: NEGATIVE mg/dL
LEUKOCYTES UA: NEGATIVE
NITRITE: NEGATIVE
PH: 5.5 (ref 5.0–8.0)
Protein, ur: NEGATIVE mg/dL
SPECIFIC GRAVITY, URINE: 1.028 (ref 1.005–1.030)
Urobilinogen, UA: 0.2 mg/dL (ref 0.0–1.0)

## 2014-06-18 LAB — PREGNANCY, URINE: PREG TEST UR: NEGATIVE

## 2014-06-18 MED ORDER — ONDANSETRON HCL 4 MG/2ML IJ SOLN
4.0000 mg | Freq: Once | INTRAMUSCULAR | Status: AC
  Administered 2014-06-18: 4 mg via INTRAVENOUS
  Filled 2014-06-18: qty 2

## 2014-06-18 MED ORDER — ACETAMINOPHEN 500 MG PO TABS
1000.0000 mg | ORAL_TABLET | Freq: Once | ORAL | Status: AC
  Administered 2014-06-18: 1000 mg via ORAL
  Filled 2014-06-18: qty 2

## 2014-06-18 MED ORDER — SODIUM CHLORIDE 0.9 % IV BOLUS (SEPSIS)
1000.0000 mL | Freq: Once | INTRAVENOUS | Status: AC
  Administered 2014-06-18: 1000 mL via INTRAVENOUS

## 2014-06-18 MED ORDER — ONDANSETRON HCL 4 MG PO TABS: 4.0000 mg | ORAL_TABLET | Freq: Four times a day (QID) | ORAL | Status: DC

## 2014-06-18 NOTE — ED Notes (Signed)
Pt reports N/V/D that started last night at 9pm.  States that everyone in her house has had the same virus.  No vomiting noted.  Pt ambulatory.  A/O

## 2014-06-18 NOTE — ED Notes (Signed)
Pt discharged to home with family. NAD.  

## 2014-06-18 NOTE — ED Provider Notes (Signed)
CSN: [id number]     Arrival date & time 06/18/14  0844 History   First MD Initiated Contact with Patient 06/18/14 [phone]     Chief Complaint  Patient presents with  . Emesis     (Consider location/radiation/quality/duration/timing/severity/associated sxs/prior Treatment) Patient is a 28 y.o. female presenting with vomiting.  Emesis Severity:  Severe Duration:  8 hours Timing:  Constant Quality:  Stomach contents Relieved by: not drinking any fluids. Worsened by:  Liquids Associated symptoms: chills, diarrhea, headaches and myalgias   Associated symptoms: no abdominal pain and no fever     Past Medical History  Diagnosis Date  . Bipolar 1 disorder   . IBS (irritable bowel syndrome)    Past Surgical History  Procedure Laterality Date  . Cesarean section    . Tonsillectomy     History reviewed. No pertinent family history. History  Substance Use Topics  . Smoking status: Former Games developer  . Smokeless tobacco: Not on file  . Alcohol Use: Yes   OB History   Grav Para Term Preterm Abortions TAB SAB Ect Mult Living                 Review of Systems  Constitutional: Positive for chills.  Gastrointestinal: Positive for vomiting and diarrhea. Negative for abdominal pain.  Musculoskeletal: Positive for myalgias.  Neurological: Positive for headaches.  All other systems reviewed and are negative.     Allergies  Amoxicillin  Home Medications   Prior to Admission medications   Medication Sig Start Date End Date Taking? Authorizing Provider  albuterol (PROVENTIL HFA;VENTOLIN HFA) 108 (90 BASE) MCG/ACT inhaler Inhale 2 puffs into the lungs every 6 (six) hours as needed for wheezing or shortness of breath.    Historical Provider, MD  artificial tears (LACRILUBE) OINT ophthalmic ointment Place into the left eye at bedtime as needed for dry eyes. 01/08/14   Ward Givens, MD  busPIRone (BUSPAR) 10 MG tablet Take 10 mg by mouth 3 (three) times daily.    Historical Provider, MD   DULoxetine (CYMBALTA) 60 MG capsule Take 60 mg by mouth daily.    Historical Provider, MD  predniSONE (DELTASONE) 20 MG tablet Take 3 po QD x 3d then 2 po QD x 3d then 1 po QD x 3d 01/08/14   Ward Givens, MD  Topiramate (TOPAMAX PO) Take by mouth 3 (three) times daily.     Historical Provider, MD   BP 128/99  Pulse 120  Temp(Src) 98.4 F (36.9 C) (Oral)  Resp 18  SpO2 100% Physical Exam  Nursing note and vitals reviewed. Constitutional: She is oriented to person, place, and time. She appears well-developed and well-nourished. No distress.  HENT:  Head: Normocephalic and atraumatic.  Mouth/Throat: Oropharynx is clear and moist.  Eyes: Conjunctivae are normal. Pupils are equal, round, and reactive to light. No scleral icterus.  Neck: Neck supple.  Cardiovascular: Regular rhythm, normal heart sounds and intact distal pulses.  Tachycardia present.   No murmur heard. Pulmonary/Chest: Effort normal and breath sounds normal. No stridor. No respiratory distress. She has no rales.  Abdominal: Soft. Bowel sounds are normal. She exhibits no distension. There is no tenderness. There is no rebound and no guarding.  Musculoskeletal: Normal range of motion.  Neurological: She is alert and oriented to person, place, and time.  Skin: Skin is warm and dry. No rash noted.  Psychiatric: She has a normal mood and affect. Her behavior is normal.    ED Course  Procedures (including  critical care time) Labs Review Labs Reviewed  URINALYSIS, ROUTINE W REFLEX MICROSCOPIC  PREGNANCY, URINE    Imaging Review No results found.   EKG Interpretation None      MDM   Final diagnoses:  Nausea vomiting and diarrhea    28 year old female who presents with vomiting and diarrhea. Multiple sick contacts with similar symptoms in her household. No abdominal pain, no abdominal tenderness. She has decreased PO fluid intake and is slightly tachycardic, likely secondary to mild dehydration.  IV fluids, IV  zofran ordered.    I expect a self limited illness.  Advised further supportive care and PO hydration .  11:49 AM HR improved, pt felt better, tolerated PO.  Stable for discharge.  Warnell Foresterrey Lebert Lovern, MD 06/18/14 1150

## 2014-09-26 ENCOUNTER — Emergency Department (HOSPITAL_BASED_OUTPATIENT_CLINIC_OR_DEPARTMENT_OTHER): Payer: BC Managed Care – PPO

## 2014-09-26 ENCOUNTER — Encounter (HOSPITAL_BASED_OUTPATIENT_CLINIC_OR_DEPARTMENT_OTHER): Payer: Self-pay

## 2014-09-26 ENCOUNTER — Emergency Department (HOSPITAL_BASED_OUTPATIENT_CLINIC_OR_DEPARTMENT_OTHER)
Admission: EM | Admit: 2014-09-26 | Discharge: 2014-09-26 | Disposition: A | Discharge status: Home / Self Care | Payer: BC Managed Care – PPO | Attending: Emergency Medicine | Admitting: Emergency Medicine

## 2014-09-26 DIAGNOSIS — Z880 Allergy status to penicillin: Secondary | ICD-10-CM | POA: Insufficient documentation

## 2014-09-26 DIAGNOSIS — H748X1 Other specified disorders of right middle ear and mastoid: Secondary | ICD-10-CM | POA: Insufficient documentation

## 2014-09-26 DIAGNOSIS — H748X2 Other specified disorders of left middle ear and mastoid: Secondary | ICD-10-CM | POA: Insufficient documentation

## 2014-09-26 DIAGNOSIS — R059 Cough, unspecified: Secondary | ICD-10-CM

## 2014-09-26 DIAGNOSIS — F319 Bipolar disorder, unspecified: Secondary | ICD-10-CM | POA: Insufficient documentation

## 2014-09-26 DIAGNOSIS — Z8719 Personal history of other diseases of the digestive system: Secondary | ICD-10-CM | POA: Insufficient documentation

## 2014-09-26 DIAGNOSIS — J069 Acute upper respiratory infection, unspecified: Secondary | ICD-10-CM | POA: Insufficient documentation

## 2014-09-26 DIAGNOSIS — Z87891 Personal history of nicotine dependence: Secondary | ICD-10-CM | POA: Insufficient documentation

## 2014-09-26 DIAGNOSIS — R000 Tachycardia, unspecified: Secondary | ICD-10-CM | POA: Insufficient documentation

## 2014-09-26 DIAGNOSIS — R05 Cough: Secondary | ICD-10-CM

## 2014-09-26 DIAGNOSIS — Z79899 Other long term (current) drug therapy: Secondary | ICD-10-CM | POA: Insufficient documentation

## 2014-09-26 MED ORDER — HYDROCODONE-HOMATROPINE 5-1.5 MG/5ML PO SYRP: 5.0000 mL | ORAL_SOLUTION | Freq: Four times a day (QID) | ORAL | Status: DC | PRN

## 2014-09-26 NOTE — ED Notes (Signed)
C/o cough, generalized pain, sinus pain-states she was treated for sinus infection with abx complete 1 month ago

## 2014-09-26 NOTE — ED Notes (Signed)
MD at bedside. 

## 2014-09-26 NOTE — ED Notes (Signed)
Patient transported to X-ray 

## 2014-09-26 NOTE — ED Provider Notes (Addendum)
CSN: [id number]     Arrival date & time 09/26/14  1205 History   First MD Initiated Contact with Patient 09/26/14 1233     Chief Complaint  Patient presents with  . Cough     (Consider location/radiation/quality/duration/timing/severity/associated sxs/prior Treatment) Patient is a 29 y.o. female presenting with cough. The history is provided by the patient.  Cough Cough characteristics:  Non-productive, hacking and dry Severity:  Severe Onset quality:  Gradual Duration:  3 days Timing:  Constant Progression:  Worsening Chronicity:  New Smoker: no   Context: upper respiratory infection   Relieved by:  Nothing Exacerbated by: lying down. Ineffective treatments:  Decongestant Associated symptoms: chills, ear fullness, ear pain, myalgias, rhinorrhea and sinus congestion   Associated symptoms: no fever, no shortness of breath and no wheezing   Risk factors comment:  Treated for 9 days with azithro without improvement of her sinuses.  completed abx course on dec 24th   Past Medical History  Diagnosis Date  . Bipolar 1 disorder   . IBS (irritable bowel syndrome)    Past Surgical History  Procedure Laterality Date  . Cesarean section    . Tonsillectomy    . Adenoidectomy     No family history on file. History  Substance Use Topics  . Smoking status: Former Games developermoker  . Smokeless tobacco: Not on file  . Alcohol Use: Yes   OB History    No data available     Review of Systems  Constitutional: Positive for chills. Negative for fever.  HENT: Positive for ear pain and rhinorrhea.   Respiratory: Positive for cough. Negative for shortness of breath and wheezing.   Musculoskeletal: Positive for myalgias.  All other systems reviewed and are negative.     Allergies  Amoxicillin  Home Medications   Prior to Admission medications   Medication Sig Start Date End Date Taking? Authorizing Provider  Sertraline HCl (ZOLOFT PO) Take by mouth.   Yes Historical Provider, MD   albuterol (PROVENTIL HFA;VENTOLIN HFA) 108 (90 BASE) MCG/ACT inhaler Inhale 2 puffs into the lungs every 6 (six) hours as needed for wheezing or shortness of breath.    Historical Provider, MD  busPIRone (BUSPAR) 10 MG tablet Take 10 mg by mouth 3 (three) times daily.    Historical Provider, MD  Topiramate (TOPAMAX PO) Take by mouth 3 (three) times daily.     Historical Provider, MD   BP 129/98 mmHg  Pulse 125  Temp(Src) 98.4 F (36.9 C) (Oral)  Resp 22  Ht 5\' 6"  (1.676 m)  Wt 220 lb (99.791 kg)  BMI 35.53 kg/m2  SpO2 100%  LMP 08/27/2014 Physical Exam  Constitutional: She is oriented to person, place, and time. She appears well-developed and well-nourished. No distress.  HENT:  Head: Normocephalic and atraumatic.  Right Ear: A middle ear effusion is present.  Left Ear: A middle ear effusion is present.  Nose: Mucosal edema and rhinorrhea present.  Mouth/Throat: Oropharynx is clear and moist and mucous membranes are normal. No oropharyngeal exudate, posterior oropharyngeal edema or posterior oropharyngeal erythema.  Eyes: Conjunctivae and EOM are normal. Pupils are equal, round, and reactive to light.  Neck: Normal range of motion. Neck supple.  Cardiovascular: Regular rhythm and intact distal pulses.  Tachycardia present.   No murmur heard. Pulmonary/Chest: Effort normal and breath sounds normal. No respiratory distress. She has no wheezes. She has no rales. She exhibits tenderness.  Coughing consistently on exam  Abdominal: Soft. She exhibits no distension. There is no  tenderness. There is no rebound and no guarding.  Musculoskeletal: Normal range of motion. She exhibits no edema or tenderness.  Neurological: She is alert and oriented to person, place, and time.  Skin: Skin is warm and dry. No rash noted. No erythema.  Psychiatric: She has a normal mood and affect. Her behavior is normal.  Nursing note and vitals reviewed.   ED Course  Procedures (including critical care  time) Labs Review Labs Reviewed - No data to display  Imaging Review Dg Chest 2 View  09/26/2014   CLINICAL DATA:  29 year old female with 1 month history of cough and sinus congestion.  EXAM: CHEST  2 VIEW  COMPARISON:  Prior chest x-ray and chest CT 12/29/2013  FINDINGS: The lungs are clear and negative for focal airspace consolidation, pulmonary edema or suspicious pulmonary nodule. No pleural effusion or pneumothorax. Cardiac and mediastinal contours are within normal limits. No acute fracture or lytic or blastic osseous lesions. The visualized upper abdominal bowel gas pattern is unremarkable.  IMPRESSION: Negative chest x-ray.   Electronically Signed   By: Malachy Moan M.D.   On: 09/26/2014 13:03     EKG Interpretation None      MDM   Final diagnoses:  Cough  URI (upper respiratory infection)    Pt with symptoms consistent with viral URI.  Well appearing here.  No signs of breathing difficulty  No signs of pharyngitis, otitis or abnormal abdominal findings.   CXR wnl and pt to return with any further problems.     Gwyneth Sprout, MD 09/26/14 1311  Gwyneth Sprout, MD 09/26/14 1313

## 2014-10-15 IMAGING — CT CT ANGIO CHEST
2 of 6 series · 19 of 36 positions shown · IV contrast (APPLIED)
Comparison: DG CHEST 2 VIEW dated 12/29/2013

CLINICAL DATA: Chest pain and shortness of breath.

EXAM:
CT ANGIOGRAPHY CHEST WITH CONTRAST
TECHNIQUE: Multidetector CT imaging of the chest was performed using the
standard protocol during bolus administration of intravenous
contrast. Multiplanar CT image reconstructions and MIPs were
obtained to evaluate the vascular anatomy.
CONTRAST:  100mL OMNIPAQUE IOHEXOL 350 MG/ML SOLN

[Series 6: pe 1.0 b26f · axial · 0.68mm/px · z∈[-292,-47]mm · 18 of 273 slices shown]
[im 14/273  lung]
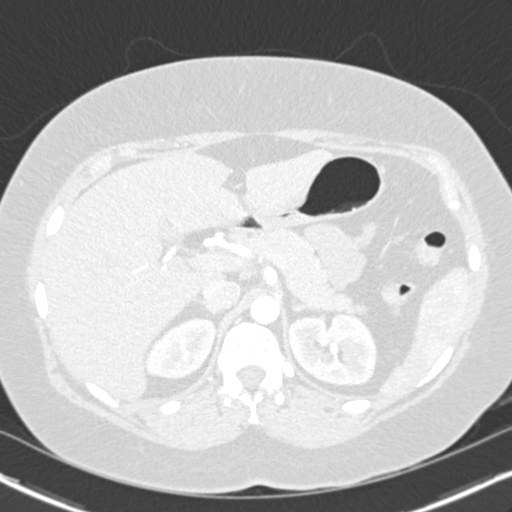
[im 28/273  mediastinal]
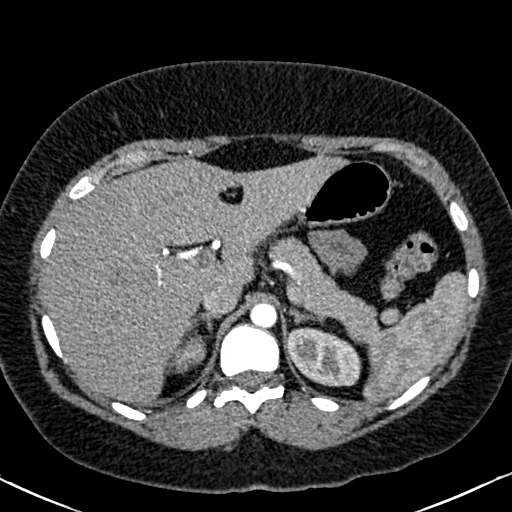
[im 41/273  lung]
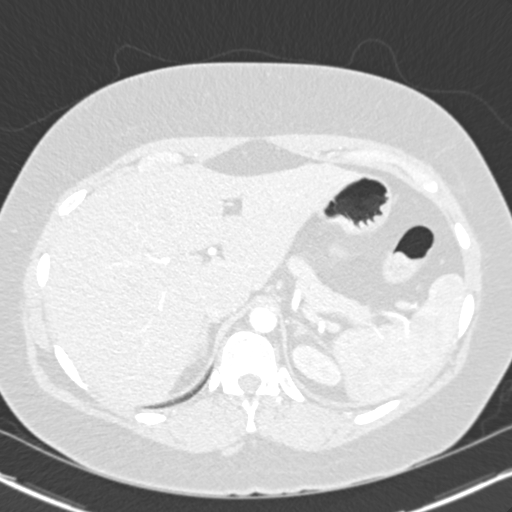
[im 55/273  mediastinal]
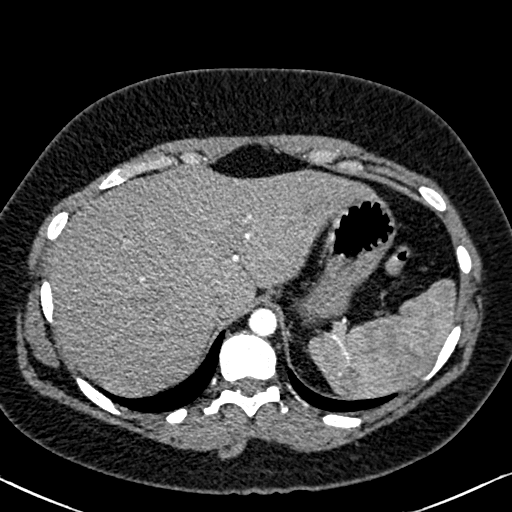
[im 69/273  lung]
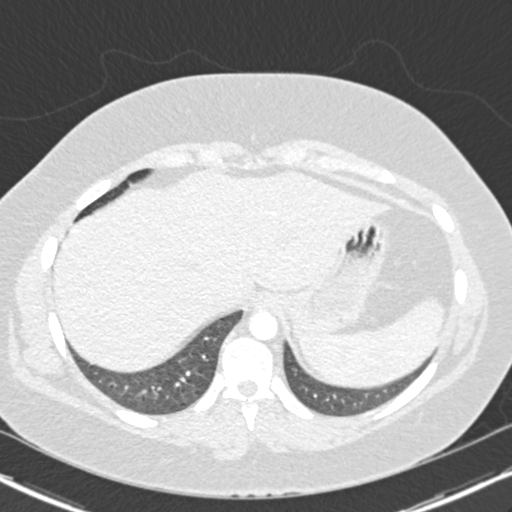
[im 82/273  mediastinal]
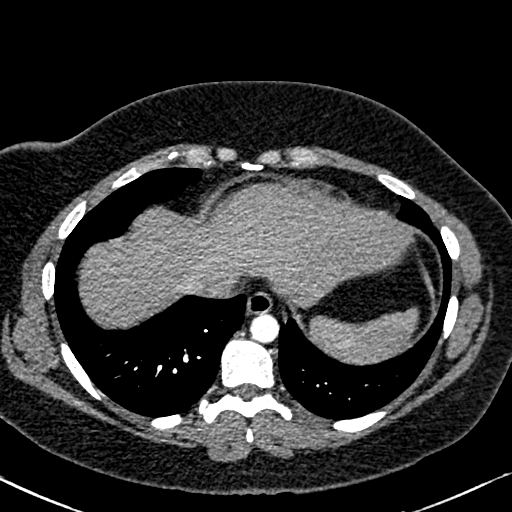
[im 96/273  lung]
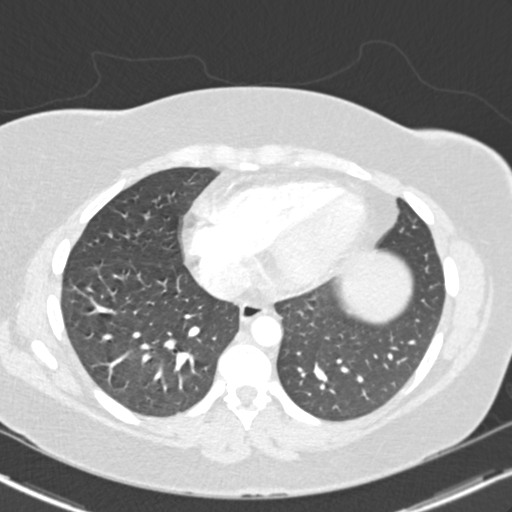
[im 109/273  mediastinal]
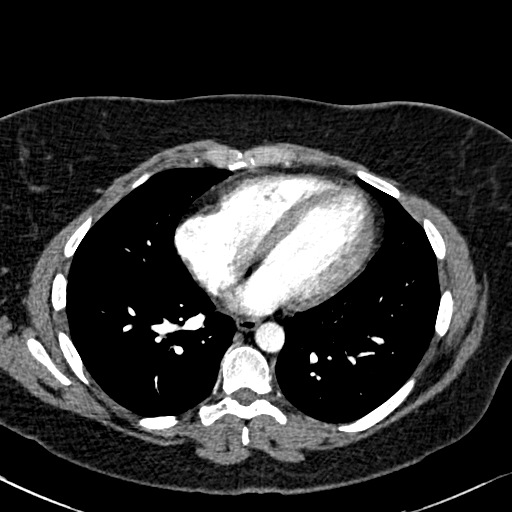
[im 123/273  lung]
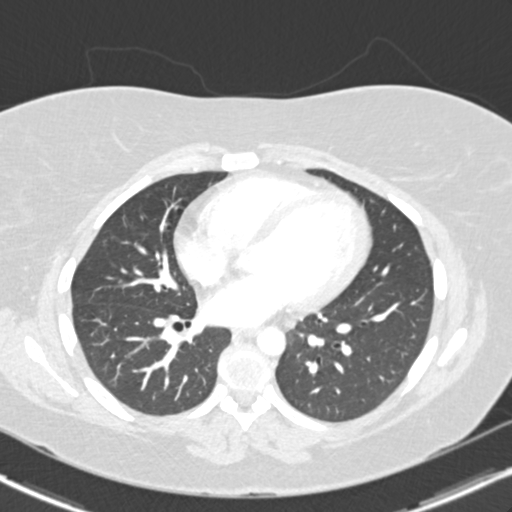
[im 150/273  mediastinal]
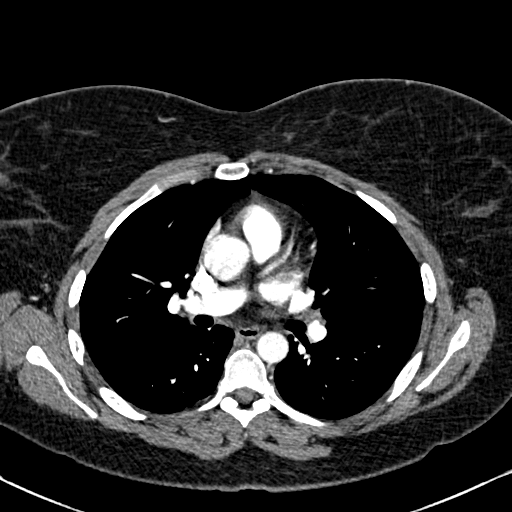
[im 164/273  lung]
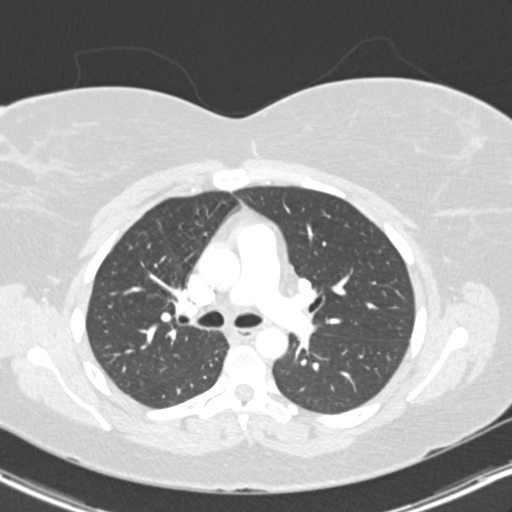
[im 177/273  mediastinal]
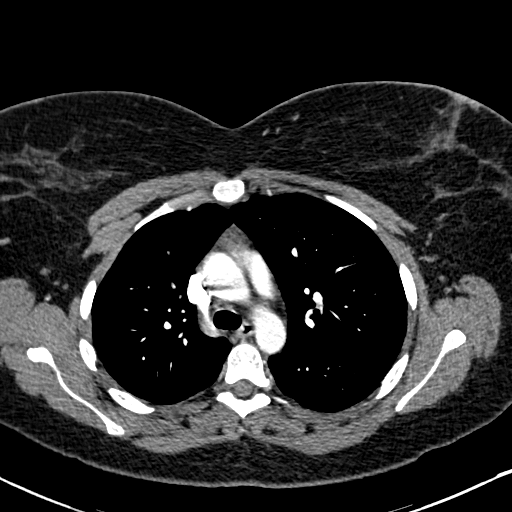
[im 191/273  lung]
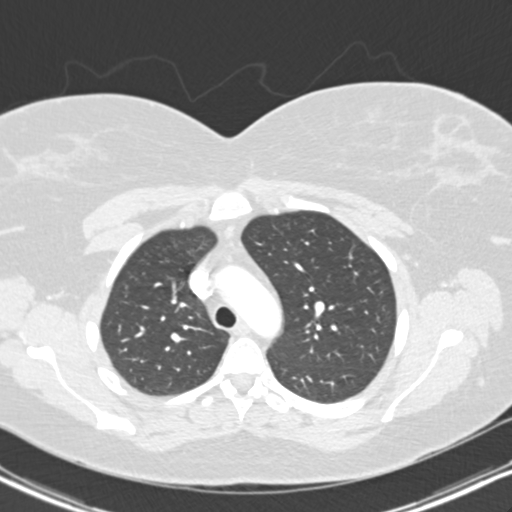
[im 205/273  mediastinal]
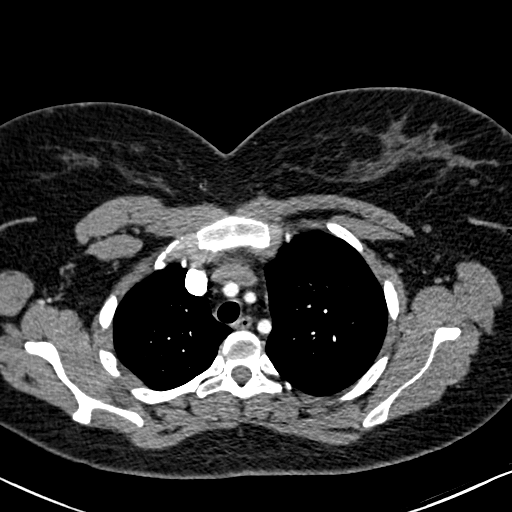
[im 218/273  lung]
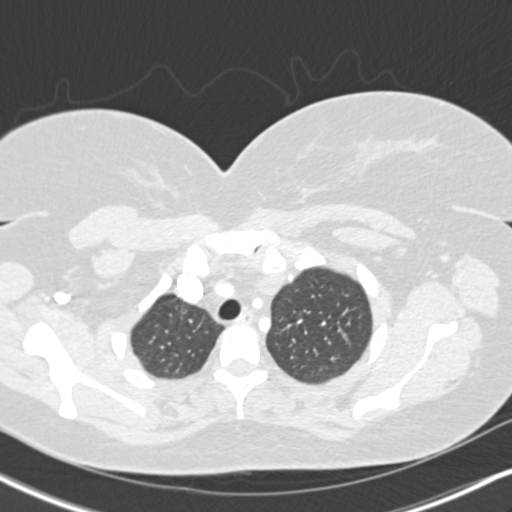
[im 232/273  mediastinal]
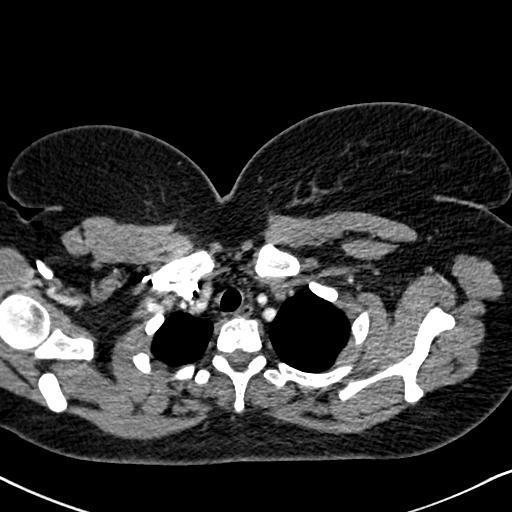
[im 245/273  lung]
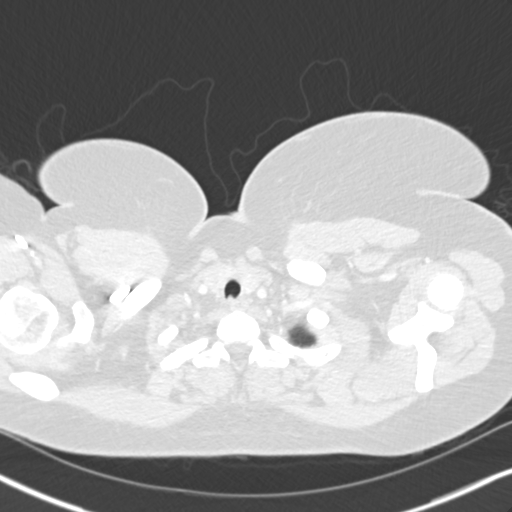
[im 259/273  mediastinal]
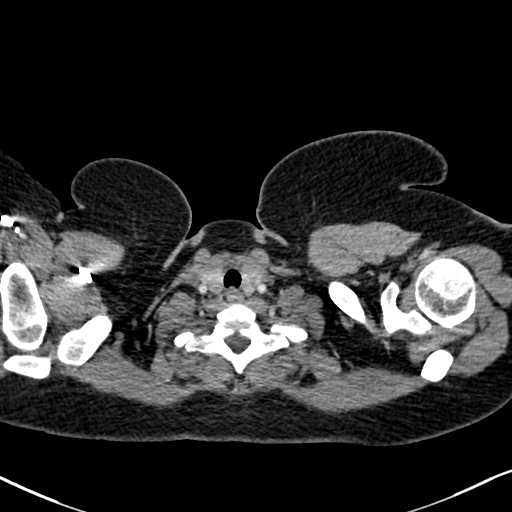

[Series 9: pe 2.0 coronal · coronal · 0.55mm/px · 1 of 106 slices shown]
[im 53/106  mediastinal]
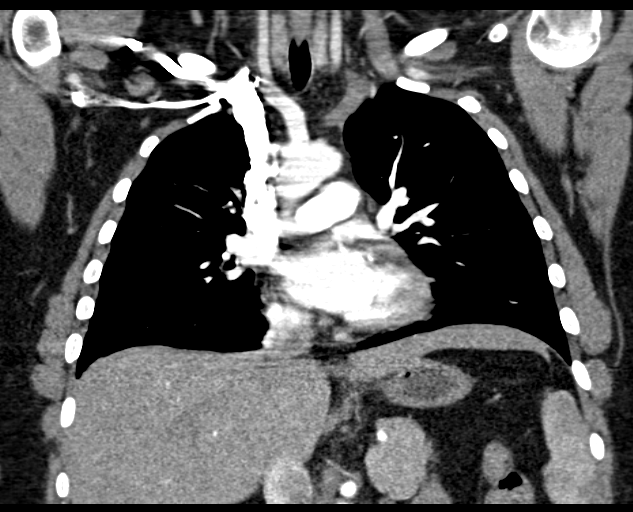

[19 of 36 positions shown; findings below may reference images not displayed]

FINDINGS: The pulmonary arteries are very well opacified. There is no evidence
of pulmonary embolism. The thoracic aorta is also well opacified and
shows normal caliber and patency.

The mediastinum is normal. The heart size is within normal limits.
No pleural or pericardial fluid is identified. Lungs show no
evidence of infiltrates, pneumothorax, nodule or edema.

Visualized airways are normally patent. No evidence of
lymphadenopathy. Visualized upper abdomen is unremarkable. Bony
structures are within normal limits.

Review of the MIP images confirms the above findings.
IMPRESSION: Normal CTA of the chest.

## 2014-11-15 LAB — OB RESULTS CONSOLE RPR: RPR: NONREACTIVE

## 2014-11-15 LAB — OB RESULTS CONSOLE ABO/RH: RH Type: POSITIVE

## 2014-11-15 LAB — OB RESULTS CONSOLE RUBELLA ANTIBODY, IGM: RUBELLA: IMMUNE

## 2014-11-15 LAB — OB RESULTS CONSOLE ANTIBODY SCREEN: Antibody Screen: NEGATIVE

## 2014-11-15 LAB — OB RESULTS CONSOLE GC/CHLAMYDIA
CHLAMYDIA, DNA PROBE: NEGATIVE
Gonorrhea: NEGATIVE

## 2014-11-15 LAB — OB RESULTS CONSOLE HEPATITIS B SURFACE ANTIGEN: HEP B S AG: NEGATIVE

## 2014-11-15 LAB — OB RESULTS CONSOLE HIV ANTIBODY (ROUTINE TESTING): HIV: NONREACTIVE

## 2015-03-24 ENCOUNTER — Emergency Department (HOSPITAL_BASED_OUTPATIENT_CLINIC_OR_DEPARTMENT_OTHER): Payer: BLUE CROSS/BLUE SHIELD

## 2015-03-24 ENCOUNTER — Encounter (HOSPITAL_BASED_OUTPATIENT_CLINIC_OR_DEPARTMENT_OTHER): Payer: Self-pay | Admitting: *Deleted

## 2015-03-24 ENCOUNTER — Emergency Department (HOSPITAL_BASED_OUTPATIENT_CLINIC_OR_DEPARTMENT_OTHER)
Admission: EM | Admit: 2015-03-24 | Discharge: 2015-03-24 | Disposition: A | Discharge status: Home / Self Care | Payer: BLUE CROSS/BLUE SHIELD | Attending: Emergency Medicine | Admitting: Emergency Medicine

## 2015-03-24 DIAGNOSIS — O99512 Diseases of the respiratory system complicating pregnancy, second trimester: Secondary | ICD-10-CM | POA: Insufficient documentation

## 2015-03-24 DIAGNOSIS — J029 Acute pharyngitis, unspecified: Secondary | ICD-10-CM | POA: Insufficient documentation

## 2015-03-24 DIAGNOSIS — Z3A24 24 weeks gestation of pregnancy: Secondary | ICD-10-CM | POA: Diagnosis not present

## 2015-03-24 DIAGNOSIS — F319 Bipolar disorder, unspecified: Secondary | ICD-10-CM | POA: Diagnosis not present

## 2015-03-24 DIAGNOSIS — J019 Acute sinusitis, unspecified: Secondary | ICD-10-CM | POA: Insufficient documentation

## 2015-03-24 DIAGNOSIS — Z8719 Personal history of other diseases of the digestive system: Secondary | ICD-10-CM | POA: Diagnosis not present

## 2015-03-24 DIAGNOSIS — J329 Chronic sinusitis, unspecified: Secondary | ICD-10-CM

## 2015-03-24 DIAGNOSIS — Z87891 Personal history of nicotine dependence: Secondary | ICD-10-CM | POA: Insufficient documentation

## 2015-03-24 DIAGNOSIS — Z880 Allergy status to penicillin: Secondary | ICD-10-CM | POA: Insufficient documentation

## 2015-03-24 DIAGNOSIS — B9689 Other specified bacterial agents as the cause of diseases classified elsewhere: Secondary | ICD-10-CM

## 2015-03-24 DIAGNOSIS — Z79899 Other long term (current) drug therapy: Secondary | ICD-10-CM | POA: Diagnosis not present

## 2015-03-24 DIAGNOSIS — O99342 Other mental disorders complicating pregnancy, second trimester: Secondary | ICD-10-CM | POA: Insufficient documentation

## 2015-03-24 LAB — BASIC METABOLIC PANEL
ANION GAP: 7 (ref 5–15)
BUN: 8 mg/dL (ref 6–20)
CO2: 23 mmol/L (ref 22–32)
Calcium: 8.7 mg/dL — ABNORMAL LOW (ref 8.9–10.3)
Chloride: 104 mmol/L (ref 101–111)
Creatinine, Ser: 0.44 mg/dL (ref 0.44–1.00)
GFR calc Af Amer: >60 mL/min (ref >60)
GLUCOSE: 90 mg/dL (ref 65–99)
POTASSIUM: 3.7 mmol/L (ref 3.5–5.1)
Sodium: 134 mmol/L — ABNORMAL LOW (ref 135–145)

## 2015-03-24 LAB — CBC WITH DIFFERENTIAL/PLATELET
Basophils Absolute: 0 10*3/uL (ref 0.0–0.1)
Basophils Relative: 0 % (ref 0–1)
Eosinophils Absolute: 0.2 10*3/uL (ref 0.0–0.7)
Eosinophils Relative: 2 % (ref 0–5)
HCT: 29.8 % — ABNORMAL LOW (ref 36.0–46.0)
Hemoglobin: 9.9 g/dL — ABNORMAL LOW (ref 12.0–15.0)
Lymphocytes Relative: 23 % (ref 12–46)
Lymphs Abs: 2.4 10*3/uL (ref 0.7–4.0)
MCH: 28.9 pg (ref 26.0–34.0)
MCHC: 33.2 g/dL (ref 30.0–36.0)
MCV: 86.9 fL (ref 78.0–100.0)
Monocytes Absolute: 0.8 10*3/uL (ref 0.1–1.0)
Monocytes Relative: 8 % (ref 3–12)
NEUTROS PCT: 67 % (ref 43–77)
Neutro Abs: 7.1 10*3/uL (ref 1.7–7.7)
PLATELETS: 271 10*3/uL (ref 150–400)
RBC: 3.43 MIL/uL — ABNORMAL LOW (ref 3.87–5.11)
RDW: 14.4 % (ref 11.5–15.5)
WBC: 10.4 10*3/uL (ref 4.0–10.5)

## 2015-03-24 MED ORDER — AZITHROMYCIN 250 MG PO TABS: ORAL_TABLET | ORAL | Status: AC

## 2015-03-24 MED ORDER — GUAIFENESIN-DM 100-10 MG/5ML PO SYRP
10.0000 mL | ORAL_SOLUTION | Freq: Once | ORAL | Status: DC
  Filled 2015-03-24: qty 10

## 2015-03-24 NOTE — ED Notes (Signed)
Dr. Jaquita RectorMelancon states pt must be seen by Dr. Deretha EmoryZackowski prior to leaving. Holding discharge until Dr. Deretha EmoryZackowski sees the pt.

## 2015-03-24 NOTE — Discharge Instructions (Signed)
Sinusitis °Sinusitis is redness, soreness, and inflammation of the paranasal sinuses. Paranasal sinuses are air pockets within the bones of your face (beneath the eyes, the middle of the forehead, or above the eyes). In healthy paranasal sinuses, mucus is able to drain out, and air is able to circulate through them by way of your nose. However, when your paranasal sinuses are inflamed, mucus and air can become trapped. This can allow bacteria and other germs to grow and cause infection. °Sinusitis can develop quickly and last only a short time (acute) or continue over a long period (chronic). Sinusitis that lasts for more than 12 weeks is considered chronic.  °CAUSES  °Causes of sinusitis include: °· Allergies. °· Structural abnormalities, such as displacement of the cartilage that separates your nostrils (deviated septum), which can decrease the air flow through your nose and sinuses and affect sinus drainage. °· Functional abnormalities, such as when the small hairs (cilia) that line your sinuses and help remove mucus do not work properly or are not present. °SIGNS AND SYMPTOMS  °Symptoms of acute and chronic sinusitis are the same. The primary symptoms are pain and pressure around the affected sinuses. Other symptoms include: °· Upper toothache. °· Earache. °· Headache. °· Bad breath. °· Decreased sense of smell and taste. °· A cough, which worsens when you are lying flat. °· Fatigue. °· Fever. °· Thick drainage from your nose, which often is green and may contain pus (purulent). °· Swelling and warmth over the affected sinuses. °DIAGNOSIS  °Your health care provider will perform a physical exam. During the exam, your health care provider may: °· Look in your nose for signs of abnormal growths in your nostrils (nasal polyps). °· Tap over the affected sinus to check for signs of infection. °· View the inside of your sinuses (endoscopy) using an imaging device that has a light attached (endoscope). °If your health  care provider suspects that you have chronic sinusitis, one or more of the following tests may be recommended: °· Allergy tests. °· Nasal culture. A sample of mucus is taken from your nose, sent to a lab, and screened for bacteria. °· Nasal cytology. A sample of mucus is taken from your nose and examined by your health care provider to determine if your sinusitis is related to an allergy. °TREATMENT  °Most cases of acute sinusitis are related to a viral infection and will resolve on their own within 10 days. Sometimes medicines are prescribed to help relieve symptoms (pain medicine, decongestants, nasal steroid sprays, or saline sprays).  °However, for sinusitis related to a bacterial infection, your health care provider will prescribe antibiotic medicines. These are medicines that will help kill the bacteria causing the infection.  °Rarely, sinusitis is caused by a fungal infection. In theses cases, your health care provider will prescribe antifungal medicine. °For some cases of chronic sinusitis, surgery is needed. Generally, these are cases in which sinusitis recurs more than 3 times per year, despite other treatments. °HOME CARE INSTRUCTIONS  °· Drink plenty of water. Water helps thin the mucus so your sinuses can drain more easily. °· Use a humidifier. °· Inhale steam 3 to 4 times a day (for example, sit in the bathroom with the shower running). °· Apply a warm, moist washcloth to your face 3 to 4 times a day, or as directed by your health care provider. °· Use saline nasal sprays to help moisten and clean your sinuses. °· Take medicines only as directed by your health care provider. °·   If you were prescribed either an antibiotic or antifungal medicine, finish it all even if you start to feel better. SEEK IMMEDIATE MEDICAL CARE IF:  You have increasing pain or severe headaches.  You have nausea, vomiting, or drowsiness.  You have swelling around your face.  You have vision problems.  You have a stiff  neck.  You have difficulty breathing. MAKE SURE YOU:   Understand these instructions.  Will watch your condition.  Will get help right away if you are not doing well or get worse. Document Released: 09/02/2005 Document Revised: 01/17/2014 Document Reviewed: 09/17/2011 [facility] Patient Information 2015 [place], [place]. This information is not intended to replace advice given to you by your health care provider. Make sure you discuss any questions you have with your health care provider.   Anemia, Nonspecific Anemia is a condition in which the concentration of red blood cells or hemoglobin in the blood is below normal. Hemoglobin is a substance in red blood cells that carries oxygen to the tissues of the body. Anemia results in not enough oxygen reaching these tissues.  CAUSES  Common causes of anemia include:   Excessive bleeding. Bleeding may be internal or external. This includes excessive bleeding from periods (in women) or from the intestine.   Poor nutrition.   Chronic kidney, thyroid, and liver disease.  Bone marrow disorders that decrease red blood cell production.  Cancer and treatments for cancer.  HIV, AIDS, and their treatments.  Spleen problems that increase red blood cell destruction.  Blood disorders.  Excess destruction of red blood cells due to infection, medicines, and autoimmune disorders. SIGNS AND SYMPTOMS   Minor weakness.   Dizziness.   Headache.  Palpitations.   Shortness of breath, especially with exercise.   Paleness.  Cold sensitivity.  Indigestion.  Nausea.  Difficulty sleeping.  Difficulty concentrating. Symptoms may occur suddenly or they may develop slowly.  DIAGNOSIS  Additional blood tests are often needed. These help your health care provider determine the best treatment. Your health care provider will check your stool for blood and look for other causes of blood loss.  TREATMENT  Treatment varies depending on the  cause of the anemia. Treatment can include:   Supplements of iron, vitamin B12, or folic acid.   Hormone medicines.   A blood transfusion. This may be needed if blood loss is severe.   Hospitalization. This may be needed if there is significant continual blood loss.   Dietary changes.  Spleen removal. HOME CARE INSTRUCTIONS Keep all follow-up appointments. It often takes many weeks to correct anemia, and having your health care provider check on your condition and your response to treatment is very important. SEEK IMMEDIATE MEDICAL CARE IF:   You develop extreme weakness, shortness of breath, or chest pain.   You become dizzy or have trouble concentrating.  You develop heavy vaginal bleeding.   You develop a rash.   You have bloody or black, tarry stools.   You faint.   You vomit up blood.   You vomit repeatedly.   You have abdominal pain.  You have a fever or persistent symptoms for more than 2-3 days.   You have a fever and your symptoms suddenly get worse.   You are dehydrated.  MAKE SURE YOU:  Understand these instructions.  Will watch your condition.  Will get help right away if you are not doing well or get worse. Document Released: 10/10/2004 Document Revised: 05/05/2013 Document Reviewed: 02/26/2013 [facility] Patient Information 2015 [place], [place].  This information is not intended to replace advice given to you by your health care provider. Make sure you discuss any questions you have with your health care provider. ° °

## 2015-03-24 NOTE — ED Provider Notes (Signed)
CSN: [id number]     Arrival date & time 03/24/15  1043 History   First MD Initiated Contact with Patient 03/24/15 1148     Chief Complaint  Patient presents with  . Cough     (Consider location/radiation/quality/duration/timing/severity/associated sxs/prior Treatment) HPI Comments: Pt. Is a 29 y/o F who is [redacted] weeks pregnant. She has worsening upper respiratory symptoms including dry cough, throat pain, and sinus pain over the past 2 weeks. Subjective fevers and chills at home. She has tried guifinesin and decongestants with little relief. She is here due to worsening cough now with right sided chest pain on deep inspiration. No post nasal drip, no sick contacts. She has a history of sinusitis in the past.   Patient is a 29 y.o. female presenting with cough. The history is provided by the patient.  Cough Cough characteristics:  Non-productive, dry, hacking and harsh Severity:  Moderate Onset quality:  Gradual Duration:  14 days Timing:  Intermittent Progression:  Worsening Chronicity:  New Smoker: no   Context: upper respiratory infection   Context: not animal exposure, not exposure to allergens, not fumes, not occupational exposure, not sick contacts, not smoke exposure, not weather changes and not with activity   Relieved by:  Nothing Worsened by:  Deep breathing and activity Ineffective treatments:  Cough suppressants and decongestant Associated symptoms: chest pain, chills, headaches, sinus congestion and sore throat   Associated symptoms: no diaphoresis, no ear fullness, no ear pain, no eye discharge, no fever, no myalgias, no rash, no rhinorrhea, no shortness of breath, no weight loss and no wheezing     Past Medical History  Diagnosis Date  . Bipolar 1 disorder   . IBS (irritable bowel syndrome)    Past Surgical History  Procedure Laterality Date  . Cesarean section    . Tonsillectomy    . Adenoidectomy     No family history on file. History  Substance Use Topics  .  Smoking status: Former Games developermoker  . Smokeless tobacco: Not on file  . Alcohol Use: Yes   OB History    Gravida Para Term Preterm AB TAB SAB Ectopic Multiple Living   1              Review of Systems  Constitutional: Positive for chills. Negative for fever, weight loss and diaphoresis.  HENT: Positive for sore throat. Negative for ear pain and rhinorrhea.   Eyes: Negative for discharge.  Respiratory: Positive for cough. Negative for shortness of breath and wheezing.   Cardiovascular: Positive for chest pain.  Musculoskeletal: Negative for myalgias.  Skin: Negative for rash.  Neurological: Positive for headaches.      Allergies  Amoxicillin  Home Medications   Prior to Admission medications   Medication Sig Start Date End Date Taking? Authorizing Provider  busPIRone (BUSPAR) 10 MG tablet Take 10 mg by mouth 3 (three) times daily.   Yes Historical Provider, MD  levothyroxine (SYNTHROID, LEVOTHROID) 75 MCG tablet Take 75 mcg by mouth daily before breakfast.   Yes Historical Provider, MD  Sertraline HCl (ZOLOFT PO) Take by mouth.   Yes Historical Provider, MD  albuterol (PROVENTIL HFA;VENTOLIN HFA) 108 (90 BASE) MCG/ACT inhaler Inhale 2 puffs into the lungs every 6 (six) hours as needed for wheezing or shortness of breath.    Historical Provider, MD  HYDROcodone-homatropine (HYCODAN) 5-1.5 MG/5ML syrup Take 5 mLs by mouth every 6 (six) hours as needed for cough. 09/26/14   Gwyneth SproutWhitney Plunkett, MD  Topiramate (TOPAMAX PO) Take by  mouth 3 (three) times daily.     Historical Provider, MD   BP 127/71 mmHg  Pulse 76  Temp(Src) 98.2 F (36.8 C) (Tympanic)  Resp 18  Ht  (1.676 m)  Wt 218 lb (98.884 kg)  BMI 35.20 kg/m2  SpO2 92%  LMP 08/27/2014 Physical Exam  Constitutional: She is oriented to person, place, and time. She appears well-developed and well-nourished. No distress.  HENT:  Head: Normocephalic and atraumatic.  Right Ear: Tympanic membrane, external ear and ear canal  normal.  Left Ear: Tympanic membrane, external ear and ear canal normal.  Nose: Nose normal. Right sinus exhibits no maxillary sinus tenderness and no frontal sinus tenderness. Left sinus exhibits no maxillary sinus tenderness and no frontal sinus tenderness.  Mouth/Throat: Oropharynx is clear and moist and mucous membranes are normal. Normal dentition. No oropharyngeal exudate or posterior oropharyngeal erythema.  Eyes: Conjunctivae and EOM are normal. Pupils are equal, round, and reactive to light.  Neck: Normal range of motion. Neck supple.  Cardiovascular: Normal rate, regular rhythm, normal heart sounds and intact distal pulses.  Exam reveals no gallop and no friction rub.   No murmur heard. Pulmonary/Chest: Effort normal and breath sounds normal. No accessory muscle usage. No tachypnea. No respiratory distress. She has no decreased breath sounds. She has no wheezes. She has no rhonchi. She has no rales. She exhibits no tenderness.  Abdominal: Soft. Bowel sounds are normal. She exhibits no distension and no mass. There is no tenderness. There is no rebound and no guarding.  Musculoskeletal: Normal range of motion. She exhibits no edema or tenderness.  Neurological: She is alert and oriented to person, place, and time.  Skin: Skin is warm and dry. No rash noted. She is not diaphoretic. No erythema. No pallor.  Psychiatric: She has a normal mood and affect.    ED Course  Procedures (including critical care time) Labs Review Labs Reviewed  BASIC METABOLIC PANEL - Abnormal; Notable for the following:    Sodium 134 (*)    Calcium 8.7 (*)    All other components within normal limits  CBC WITH DIFFERENTIAL/PLATELET - Abnormal; Notable for the following:    RBC 3.43 (*)    Hemoglobin 9.9 (*)    HCT 29.8 (*)    All other components within normal limits    Imaging Review Dg Chest 2 View  03/24/2015   CLINICAL DATA:  29 year old female with sore throat and cough  EXAM: CHEST  2 VIEW   COMPARISON:  Chest radiograph dated 09/26/2014  FINDINGS: The heart size and mediastinal contours are within normal limits. Both lungs are clear. The visualized skeletal structures are unremarkable.  IMPRESSION: No active cardiopulmonary disease.   Electronically Signed   By: Elgie Collard M.D.   On: 03/24/2015 12:46     EKG Interpretation None      MDM   Final diagnoses:  None   Pt. Is a 29 y/o F here with worsening sinusitis symptoms and cough. WBC normal, and CXR clear. Will give Azithromycin for bacterial sinusitis. Robitussin for cough. Supportive care with plenty of fluids at home. [facility] the future given she is in the third trimester of her pregnancy now. Safe for discharge home with follow up with her PCP / OB.     Yolande Jolly, MD 03/24/15 1304  Vanetta Mulders, MD 03/24/15 1345

## 2015-03-24 NOTE — ED Notes (Signed)
Pt describes cold symptoms x2 weeks. Sts she is [redacted] weeks pregnant and has been experiencing sore throat, non-productive cough, nausea and some diarrhea.

## 2015-05-06 ENCOUNTER — Inpatient Hospital Stay (HOSPITAL_COMMUNITY)
Admission: AD | Admit: 2015-05-06 | Discharge: 2015-05-06 | Disposition: A | Discharge status: Home / Self Care | Payer: BLUE CROSS/BLUE SHIELD | Source: Ambulatory Visit | Attending: Obstetrics and Gynecology | Admitting: Obstetrics and Gynecology

## 2015-05-06 ENCOUNTER — Encounter (HOSPITAL_COMMUNITY): Payer: Self-pay | Admitting: *Deleted

## 2015-05-06 DIAGNOSIS — Z87891 Personal history of nicotine dependence: Secondary | ICD-10-CM | POA: Insufficient documentation

## 2015-05-06 DIAGNOSIS — O4703 False labor before 37 completed weeks of gestation, third trimester: Secondary | ICD-10-CM

## 2015-05-06 DIAGNOSIS — Z79899 Other long term (current) drug therapy: Secondary | ICD-10-CM | POA: Insufficient documentation

## 2015-05-06 DIAGNOSIS — O9989 Other specified diseases and conditions complicating pregnancy, childbirth and the puerperium: Secondary | ICD-10-CM | POA: Insufficient documentation

## 2015-05-06 DIAGNOSIS — O99343 Other mental disorders complicating pregnancy, third trimester: Secondary | ICD-10-CM | POA: Insufficient documentation

## 2015-05-06 DIAGNOSIS — F319 Bipolar disorder, unspecified: Secondary | ICD-10-CM | POA: Diagnosis not present

## 2015-05-06 DIAGNOSIS — R109 Unspecified abdominal pain: Secondary | ICD-10-CM | POA: Diagnosis present

## 2015-05-06 DIAGNOSIS — Z3A30 30 weeks gestation of pregnancy: Secondary | ICD-10-CM | POA: Insufficient documentation

## 2015-05-06 DIAGNOSIS — O26899 Other specified pregnancy related conditions, unspecified trimester: Secondary | ICD-10-CM

## 2015-05-06 LAB — URINALYSIS, ROUTINE W REFLEX MICROSCOPIC
BILIRUBIN URINE: NEGATIVE
Glucose, UA: NEGATIVE mg/dL
HGB URINE DIPSTICK: NEGATIVE
Ketones, ur: NEGATIVE mg/dL
Leukocytes, UA: NEGATIVE
Nitrite: NEGATIVE
PH: 6 (ref 5.0–8.0)
PROTEIN: NEGATIVE mg/dL
Specific Gravity, Urine: 1.015 (ref 1.005–1.030)
Urobilinogen, UA: 0.2 mg/dL (ref 0.0–1.0)

## 2015-05-06 NOTE — Discharge Instructions (Signed)

## 2015-05-06 NOTE — MAU Provider Note (Signed)
Chief Complaint:  No chief complaint on file.   First Provider Initiated Contact with Patient 05/06/15 2126      HPI: Karen Yates is a 29 y.o. [phone] with history of previous LTCS at [redacted]w[redacted]d who presents to maternity admissions reporting onset of cramping and pelvic pressure while walking in [employer] today that continued despite resting and drinking fluids.  She does report improvement in symptoms since arrival in MAU. She reports good fetal movement, denies LOF, vaginal bleeding, vaginal itching/burning, urinary symptoms, h/a, dizziness, n/v, or fever/chills.    Abdominal Cramping This is a new problem. The current episode started today. The onset quality is gradual. The problem occurs intermittently. The problem has been waxing and waning. The pain is located in the generalized abdominal region. The pain is mild. The quality of the pain is cramping. The abdominal pain does not radiate. Pertinent negatives include no constipation, diarrhea, dysuria, fever, frequency, headaches, nausea or vomiting. The pain is aggravated by movement. The pain is relieved by nothing. Treatments tried: PO fluids and rest. The treatment provided no relief.    Past Medical History: Past Medical History  Diagnosis Date  . Bipolar 1 disorder   . IBS (irritable bowel syndrome)     Past obstetric history: OB History  Gravida Para Term Preterm AB SAB TAB Ectopic Multiple Living  3 1   0 0        # Outcome Date GA Lbr Len/2nd Weight Sex Delivery Anes PTL Lv  3 Current           2 Gravida           1 Para      CS-LTranv         Past Surgical History: Past Surgical History  Procedure Laterality Date  . Cesarean section    . Tonsillectomy    . Adenoidectomy      Family History: History reviewed. No pertinent family history.  Social History: Social History  Substance Use Topics  . Smoking status: Former Games developer  . Smokeless tobacco: None  . Alcohol Use: Yes     Comment: NONE  WHILE  PREG     Allergies:  Allergies  Allergen Reactions  . Amoxicillin Hives    Meds:  Prescriptions prior to admission  Medication Sig Dispense Refill Last Dose  . acetaminophen (TYLENOL) 500 MG tablet Take 500 mg by mouth every 6 (six) hours as needed for headache.   Past Week at Unknown time  . albuterol (PROVENTIL HFA;VENTOLIN HFA) 108 (90 BASE) MCG/ACT inhaler Inhale 2 puffs into the lungs every 6 (six) hours as needed for wheezing or shortness of breath.   rescue  . busPIRone (BUSPAR) 10 MG tablet Take 30 mg by mouth at bedtime.    05/05/2015 at Unknown time  . levothyroxine (SYNTHROID, LEVOTHROID) 75 MCG tablet Take 75 mcg by mouth daily before breakfast.   05/06/2015 at Unknown time  . PRESCRIPTION MEDICATION Take 1 capsule by mouth daily. Prescription Iron medication; unknown strength/formulation   05/05/2015 at Unknown time  . sertraline (ZOLOFT) 100 MG tablet Take 100 mg by mouth daily.   05/06/2015 at Unknown time    ROS:  Review of Systems  Constitutional: Negative for fever, chills and fatigue.  HENT: Negative for sinus pressure.   Eyes: Negative for photophobia.  Respiratory: Negative for shortness of breath.   Cardiovascular: Negative for chest pain.  Gastrointestinal: Negative for nausea, vomiting, diarrhea and constipation.  Genitourinary: Negative for dysuria, frequency, flank pain, vaginal  bleeding, vaginal discharge, difficulty urinating, vaginal pain and pelvic pain.  Musculoskeletal: Negative for neck pain.  Neurological: Negative for dizziness, weakness and headaches.  Psychiatric/Behavioral: Negative.      I have reviewed patient's Past Medical Hx, Surgical Hx, Family Hx, Social Hx, medications and allergies.   Physical Exam   Patient Vitals for the past 24 hrs:  BP Temp Temp src Pulse Resp Height Weight  05/06/15 2129 122/63 mmHg - - 72 - - -  05/06/15 2042 115/67 mmHg 98.2 F (36.8 C) Oral 77 20 - -  05/06/15 2017 - - - - -  (1.651 m) 99.451 kg (219 lb 4  oz)   Constitutional: Well-developed, well-nourished female in no acute distress.  Cardiovascular: normal rate Respiratory: normal effort GI: Abd soft, non-tender, gravid appropriate for gestational age. Pos BS x 4 MS: Extremities nontender, no edema, normal ROM Neurologic: Alert and oriented x 4.  GU: Neg CVAT.  Cervix 0/thick/high, posterior     FHT:  Baseline 145 , moderate variability, accelerations present, no decelerations Contractions: None on toco or to palpation   Labs: Results for orders placed or performed during the hospital encounter of 05/06/15 (from the past 24 hour(s))  Urinalysis, Routine w reflex microscopic (not at [facility])     Status: None   Collection Time: 05/06/15  8:10 PM  Result Value Ref Range   Color, Urine YELLOW YELLOW   APPearance CLEAR CLEAR   Specific Gravity, Urine 1.015 1.005 - 1.030   pH 6.0 5.0 - 8.0   Glucose, UA NEGATIVE NEGATIVE mg/dL   Hgb urine dipstick NEGATIVE NEGATIVE   Bilirubin Urine NEGATIVE NEGATIVE   Ketones, ur NEGATIVE NEGATIVE mg/dL   Protein, ur NEGATIVE NEGATIVE mg/dL   Urobilinogen, UA 0.2 0.0 - 1.0 mg/dL   Nitrite NEGATIVE NEGATIVE   Leukocytes, UA NEGATIVE NEGATIVE       MDM: I have ordered labs and reviewed results.  Consult Dr Marcelle Overlie to review assessment/labs/FHR tracing.  Pt stable at time of discharge.  Assessment: 1. Preterm contractions, third trimester   2. Abdominal pain affecting pregnancy     Plan: Discharge home PTL precautions and fetal kick counts     Follow-up Information    Follow up with Meriel Pica, MD.   Specialty:  Obstetrics and Gynecology   Why:  As scheduled   Contact information:   [address] [place] [address] [phone]       Follow up with [facility].   Why:  As needed for emergencies   Contact information:   [address] [id number] mc [place] [address] [phone]        Medication List    TAKE these medications        acetaminophen 500 MG tablet  Commonly known as:  TYLENOL  Take 500 mg by mouth every 6 (six) hours as needed for headache.     albuterol 108 (90 BASE) MCG/ACT inhaler  Commonly known as:  PROVENTIL HFA;VENTOLIN HFA  Inhale 2 puffs into the lungs every 6 (six) hours as needed for wheezing or shortness of breath.     busPIRone 10 MG tablet  Commonly known as:  BUSPAR  Take 30 mg by mouth at bedtime.     levothyroxine 75 MCG tablet  Commonly known as:  SYNTHROID, LEVOTHROID  Take 75 mcg by mouth daily before breakfast.     PRESCRIPTION MEDICATION  Take 1 capsule by mouth daily. Prescription Iron  medication; unknown strength/formulation     sertraline 100 MG tablet  Commonly known as:  ZOLOFT  Take 100 mg by mouth daily.        Sharen Counter Certified Nurse-Midwife 05/06/2015 9:37 PM

## 2015-05-06 NOTE — MAU Note (Signed)
PT SAYS SHE STARTED  CRAMPING   TODAY  AT 3 PM  WHILE  WALKING IN [employer] .   NOW  IS  SAME.   LAST OFFICE  VISIT -     04-25-2015.     NO VE.     DENIES HSV AND   MRSA.  LAST SEX-   1 WEEK.

## 2015-07-02 NOTE — H&P (Signed)
Ander Slademanda B Ruddock is a 29 y.o. female presenting for repeat C/S; previous x 2.  Antepartum course complicated by hypothyroidism; well-controlled on Synthroid.  Also, pt has Bipolar d/o on Buspar and Zoloft.  She has had severe LBP during the third trimester and the patient has taken Percocet prn.  History OB History    Gravida Para Term Preterm AB TAB SAB Ectopic Multiple Living   3 1 1  1  1   1      Past Medical History  Diagnosis Date  . Bipolar 1 disorder   . IBS (irritable bowel syndrome)    Past Surgical History  Procedure Laterality Date  . Cesarean section    . Tonsillectomy    . Adenoidectomy     Family History: family history is not on file. Social History:  reports that she has quit smoking. She does not have any smokeless tobacco history on file. She reports that she drinks alcohol. She reports that she does not use illicit drugs.   Prenatal Transfer Tool  Maternal Diabetes: No Genetic Screening: Normal Maternal Ultrasounds/Referrals: Normal Fetal Ultrasounds or other Referrals:  None Maternal Substance Abuse:  No Significant Maternal Medications:  Meds include: Zoloft Syntroid Other: Buspar, and Percocet prn. Significant Maternal Lab Results:  Lab values include: Group B Strep negative Other Comments:  None  ROS    Last menstrual period 08/27/2014. Maternal Exam:  Abdomen: Patient reports no abdominal tenderness. Surgical scars: low transverse.   Fundal height is c/w dates.   Estimated fetal weight is 8#.       Physical Exam  Constitutional: She is oriented to person, place, and time. She appears well-developed and well-nourished.  GI: Soft. There is no rebound and no guarding.  Neurological: She is alert and oriented to person, place, and time.  Skin: Skin is warm and dry.  Psychiatric: She has a normal mood and affect. Her behavior is normal.    Prenatal labs: ABO, Rh:   Antibody:   Rubella:   RPR:    HBsAg:    HIV:    GBS:      Assessment/Plan: 29yo [id number] at 39 weeks for repeat C/S. -Patient counseled for repeat c/s including risk of bleeding, infection, scarring, and damage to surrounding structures.  All questions were answered and the patient wishes to proceed.   Zyan Mirkin 07/02/2015, 2:08 PM

## 2015-07-04 ENCOUNTER — Inpatient Hospital Stay (HOSPITAL_COMMUNITY)
Admission: AD | Admit: 2015-07-04 | Discharge: 2015-07-04 | Disposition: A | Discharge status: Home / Self Care | Payer: Medicaid Other | Source: Ambulatory Visit | Attending: Obstetrics and Gynecology | Admitting: Obstetrics and Gynecology

## 2015-07-04 ENCOUNTER — Encounter (HOSPITAL_COMMUNITY): Payer: Self-pay | Admitting: *Deleted

## 2015-07-04 ENCOUNTER — Encounter (HOSPITAL_COMMUNITY): Payer: Self-pay

## 2015-07-04 ENCOUNTER — Encounter (HOSPITAL_COMMUNITY)
Admission: RE | Admit: 2015-07-04 | Discharge: 2015-07-04 | Disposition: A | Discharge status: Home / Self Care | Payer: Medicaid Other | Source: Ambulatory Visit | Attending: Obstetrics & Gynecology | Admitting: Obstetrics & Gynecology

## 2015-07-04 HISTORY — DX: Hypothyroidism, unspecified: E03.9

## 2015-07-04 HISTORY — DX: Other specified postprocedural states: Z98.890

## 2015-07-04 HISTORY — DX: Anemia, unspecified: D64.9

## 2015-07-04 HISTORY — DX: Unspecified asthma, uncomplicated: J45.909

## 2015-07-04 HISTORY — DX: Pelvic and perineal pain: R10.2

## 2015-07-04 HISTORY — DX: Pneumonia, unspecified organism: J18.9

## 2015-07-04 HISTORY — DX: Other specified pregnancy related conditions, unspecified trimester: O26.899

## 2015-07-04 HISTORY — DX: Other specified postprocedural states: R11.2

## 2015-07-04 LAB — ABO/RH: ABO/RH(D): O POS

## 2015-07-04 LAB — CBC
HCT: 30.9 % — ABNORMAL LOW (ref 36.0–46.0)
HEMOGLOBIN: 10.5 g/dL — AB (ref 12.0–15.0)
MCH: 28.2 pg (ref 26.0–34.0)
MCHC: 34 g/dL (ref 30.0–36.0)
MCV: 82.8 fL (ref 78.0–100.0)
PLATELETS: 252 10*3/uL (ref 150–400)
RBC: 3.73 MIL/uL — AB (ref 3.87–5.11)
RDW: 15.1 % (ref 11.5–15.5)
WBC: 9.5 10*3/uL (ref 4.0–10.5)

## 2015-07-04 LAB — AMNISURE RUPTURE OF MEMBRANE (ROM) NOT AT ARMC: Amnisure ROM: NEGATIVE

## 2015-07-04 MED ORDER — GENTAMICIN SULFATE 40 MG/ML IJ SOLN
INTRAVENOUS | Status: AC
  Administered 2015-07-05: 115.75 mL via INTRAVENOUS
  Filled 2015-07-04: qty 9.75

## 2015-07-04 NOTE — MAU Note (Signed)
Pt reports ? Leaking fluid since 2330

## 2015-07-04 NOTE — Patient Instructions (Signed)
Your procedure is scheduled on:  Wednesday, July 05, 2015  [facility] at: 7:00 A.M.  Pick up the phone at the desk and dial [phone].  Call this number if you have problems the morning of surgery: [phone].  Remember: Do NOT eat food or drink after: Midnight tonight Take these medicines the morning of surgery with a SIP OF WATER: LEVOTHYROXINE, ZANTAC, ZOLOFT  *BRING ASTHMA INHALER DAY OF SURGERY  Do NOT wear jewelry (body piercing), metal hair clips/bobby pins, or nail polish. Do NOT wear lotions, powders, or perfumes.  You may wear deoderant. Do NOT shave for 48 hours prior to surgery. Do NOT bring valuables to the hospital.  Leave suitcase in car.  After surgery it may be brought to your room.  For patients admitted to the hospital, checkout time is 11:00 AM the day of discharge.

## 2015-07-04 NOTE — MAU Note (Addendum)
PT SAYS ALL DAY SHE HAS HAD   DIARRHEA ALL DAY - THEN WHILE  SHE WAS ON   TOILET  FELT A GUSH OF  FLUID    AT  2330  -  BUT  NO FLUID  NOW.  [facility]   [facility]   C/S  On WED     AT 0730   WITH  DR Langston MaskerMORRIS

## 2015-07-05 ENCOUNTER — Encounter (HOSPITAL_COMMUNITY): Payer: Self-pay

## 2015-07-05 ENCOUNTER — Inpatient Hospital Stay (HOSPITAL_COMMUNITY): Payer: Medicaid Other | Admitting: Anesthesiology

## 2015-07-05 ENCOUNTER — Inpatient Hospital Stay (HOSPITAL_COMMUNITY)
Admission: RE | Admit: 2015-07-05 | Discharge: 2015-07-07 | DRG: 766 | Disposition: A | Discharge status: Home / Self Care | Payer: Medicaid Other | Source: Ambulatory Visit | Attending: Obstetrics & Gynecology | Admitting: Obstetrics & Gynecology

## 2015-07-05 ENCOUNTER — Encounter (HOSPITAL_COMMUNITY)
Admission: RE | Disposition: A | Discharge status: Home / Self Care | Payer: Self-pay | Source: Ambulatory Visit | Attending: Obstetrics & Gynecology

## 2015-07-05 DIAGNOSIS — O34211 Maternal care for low transverse scar from previous cesarean delivery: Principal | ICD-10-CM | POA: Diagnosis present

## 2015-07-05 DIAGNOSIS — O99283 Endocrine, nutritional and metabolic diseases complicating pregnancy, third trimester: Secondary | ICD-10-CM | POA: Diagnosis present

## 2015-07-05 DIAGNOSIS — F319 Bipolar disorder, unspecified: Secondary | ICD-10-CM | POA: Diagnosis present

## 2015-07-05 DIAGNOSIS — Z3A39 39 weeks gestation of pregnancy: Secondary | ICD-10-CM

## 2015-07-05 DIAGNOSIS — O99343 Other mental disorders complicating pregnancy, third trimester: Secondary | ICD-10-CM | POA: Diagnosis present

## 2015-07-05 DIAGNOSIS — E039 Hypothyroidism, unspecified: Secondary | ICD-10-CM | POA: Diagnosis present

## 2015-07-05 DIAGNOSIS — Z3483 Encounter for supervision of other normal pregnancy, third trimester: Secondary | ICD-10-CM | POA: Diagnosis present

## 2015-07-05 DIAGNOSIS — Z98891 History of uterine scar from previous surgery: Secondary | ICD-10-CM

## 2015-07-05 HISTORY — DX: Family history of other congenital malformations, deformations and chromosomal abnormalities: Z82.79

## 2015-07-05 HISTORY — DX: Acquired absence of other organs: Z90.89

## 2015-07-05 HISTORY — DX: Personal history of nicotine dependence: Z87.891

## 2015-07-05 HISTORY — DX: Personal history of other mental and behavioral disorders: Z86.59

## 2015-07-05 HISTORY — DX: Reserved for concepts with insufficient information to code with codable children: IMO0002

## 2015-07-05 HISTORY — DX: Anxiety disorder, unspecified: F41.9

## 2015-07-05 LAB — RPR: RPR Ser Ql: NONREACTIVE

## 2015-07-05 LAB — PREPARE RBC (CROSSMATCH)

## 2015-07-05 SURGERY — Surgical Case
Anesthesia: Spinal

## 2015-07-05 MED ORDER — PHENYLEPHRINE 8 MG IN D5W 100 ML (0.08MG/ML) PREMIX OPTIME
INJECTION | INTRAVENOUS | Status: AC
  Filled 2015-07-05: qty 100

## 2015-07-05 MED ORDER — DIBUCAINE 1 % RE OINT: 1.0000 "application " | TOPICAL_OINTMENT | RECTAL | Status: DC | PRN

## 2015-07-05 MED ORDER — KETOROLAC TROMETHAMINE 30 MG/ML IJ SOLN
30.0000 mg | Freq: Four times a day (QID) | INTRAMUSCULAR | Status: AC | PRN
  Administered 2015-07-05: 30 mg via INTRAMUSCULAR

## 2015-07-05 MED ORDER — PROMETHAZINE HCL 25 MG/ML IJ SOLN: 6.2500 mg | INTRAMUSCULAR | Status: DC | PRN

## 2015-07-05 MED ORDER — ALBUTEROL SULFATE (2.5 MG/3ML) 0.083% IN NEBU: 3.0000 mL | INHALATION_SOLUTION | Freq: Four times a day (QID) | RESPIRATORY_TRACT | Status: DC | PRN

## 2015-07-05 MED ORDER — FENTANYL CITRATE (PF) 100 MCG/2ML IJ SOLN
INTRAMUSCULAR | Status: AC
  Filled 2015-07-05: qty 4

## 2015-07-05 MED ORDER — SCOPOLAMINE 1 MG/3DAYS TD PT72
MEDICATED_PATCH | TRANSDERMAL | Status: AC
  Administered 2015-07-05: 1.5 mg via TRANSDERMAL
  Filled 2015-07-05: qty 1

## 2015-07-05 MED ORDER — IBUPROFEN 600 MG PO TABS
600.0000 mg | ORAL_TABLET | Freq: Four times a day (QID) | ORAL | Status: DC
  Administered 2015-07-05 – 2015-07-07 (×8): 600 mg via ORAL
  Filled 2015-07-05 (×8): qty 1

## 2015-07-05 MED ORDER — NALBUPHINE HCL 10 MG/ML IJ SOLN
5.0000 mg | INTRAMUSCULAR | Status: DC | PRN
  Administered 2015-07-05: 5 mg via SUBCUTANEOUS

## 2015-07-05 MED ORDER — MEPERIDINE HCL 25 MG/ML IJ SOLN: 6.2500 mg | INTRAMUSCULAR | Status: DC | PRN

## 2015-07-05 MED ORDER — NALOXONE HCL 1 MG/ML IJ SOLN: 1.0000 ug/kg/h | INTRAVENOUS | Status: DC | PRN

## 2015-07-05 MED ORDER — LACTATED RINGERS IV SOLN
INTRAVENOUS | Status: DC
  Administered 2015-07-05: 08:00:00 via INTRAVENOUS

## 2015-07-05 MED ORDER — BUPIVACAINE IN DEXTROSE 0.75-8.25 % IT SOLN
INTRATHECAL | Status: DC | PRN
Start: 2015-07-05 — End: 2015-07-05
  Administered 2015-07-05: 1.6 mL via INTRATHECAL

## 2015-07-05 MED ORDER — OXYTOCIN 10 UNIT/ML IJ SOLN
40.0000 [IU] | INTRAMUSCULAR | Status: DC | PRN
  Administered 2015-07-05: 40 [IU] via INTRAVENOUS

## 2015-07-05 MED ORDER — FENTANYL CITRATE (PF) 100 MCG/2ML IJ SOLN
25.0000 ug | INTRAMUSCULAR | Status: DC | PRN
  Administered 2015-07-05: 50 ug via INTRAVENOUS
  Administered 2015-07-05 (×2): 25 ug via INTRAVENOUS

## 2015-07-05 MED ORDER — KETOROLAC TROMETHAMINE 30 MG/ML IJ SOLN: 30.0000 mg | Freq: Four times a day (QID) | INTRAMUSCULAR | Status: AC | PRN

## 2015-07-05 MED ORDER — NALBUPHINE HCL 10 MG/ML IJ SOLN: 5.0000 mg | Freq: Once | INTRAMUSCULAR | Status: DC | PRN

## 2015-07-05 MED ORDER — TETANUS-DIPHTH-ACELL PERTUSSIS 5-2.5-18.5 LF-MCG/0.5 IM SUSP: 0.5000 mL | Freq: Once | INTRAMUSCULAR | Status: DC

## 2015-07-05 MED ORDER — WITCH HAZEL-GLYCERIN EX PADS: 1.0000 "application " | MEDICATED_PAD | CUTANEOUS | Status: DC | PRN

## 2015-07-05 MED ORDER — MORPHINE SULFATE (PF) 0.5 MG/ML IJ SOLN
INTRAMUSCULAR | Status: DC | PRN
  Administered 2015-07-05: .2 mg via INTRATHECAL

## 2015-07-05 MED ORDER — LACTATED RINGERS IV SOLN
Freq: Once | INTRAVENOUS | Status: AC
  Administered 2015-07-05: 1000 mL/h via INTRAVENOUS

## 2015-07-05 MED ORDER — DIPHENHYDRAMINE HCL 50 MG/ML IJ SOLN: 12.5000 mg | INTRAMUSCULAR | Status: DC | PRN

## 2015-07-05 MED ORDER — ACETAMINOPHEN 325 MG PO TABS: 650.0000 mg | ORAL_TABLET | ORAL | Status: DC | PRN

## 2015-07-05 MED ORDER — PRENATAL MULTIVITAMIN CH
1.0000 | ORAL_TABLET | Freq: Every day | ORAL | Status: DC
  Administered 2015-07-06 – 2015-07-07 (×2): 1 via ORAL
  Filled 2015-07-05 (×2): qty 1

## 2015-07-05 MED ORDER — SCOPOLAMINE 1 MG/3DAYS TD PT72
1.0000 | MEDICATED_PATCH | Freq: Once | TRANSDERMAL | Status: DC
  Administered 2015-07-05: 1.5 mg via TRANSDERMAL

## 2015-07-05 MED ORDER — LACTATED RINGERS IV SOLN
INTRAVENOUS | Status: DC
  Administered 2015-07-05: 09:00:00 via INTRAVENOUS

## 2015-07-05 MED ORDER — LEVOTHYROXINE SODIUM 75 MCG PO TABS
75.0000 ug | ORAL_TABLET | Freq: Every day | ORAL | Status: DC
  Administered 2015-07-06 – 2015-07-07 (×2): 75 ug via ORAL
  Filled 2015-07-05 (×3): qty 1

## 2015-07-05 MED ORDER — OXYCODONE-ACETAMINOPHEN 5-325 MG PO TABS: 2.0000 | ORAL_TABLET | ORAL | Status: DC | PRN

## 2015-07-05 MED ORDER — ONDANSETRON HCL 4 MG/2ML IJ SOLN
INTRAMUSCULAR | Status: DC | PRN
  Administered 2015-07-05: 4 mg via INTRAVENOUS

## 2015-07-05 MED ORDER — SCOPOLAMINE 1 MG/3DAYS TD PT72
1.0000 | MEDICATED_PATCH | Freq: Once | TRANSDERMAL | Status: DC
  Administered 2015-07-05: 1.5 mg via TRANSDERMAL
  Filled 2015-07-05: qty 1

## 2015-07-05 MED ORDER — DIPHENHYDRAMINE HCL 25 MG PO CAPS: 25.0000 mg | ORAL_CAPSULE | ORAL | Status: DC | PRN

## 2015-07-05 MED ORDER — OXYTOCIN 10 UNIT/ML IJ SOLN
INTRAMUSCULAR | Status: AC
Start: 2015-07-05 — End: 2015-07-05
  Filled 2015-07-05: qty 4

## 2015-07-05 MED ORDER — DIPHENHYDRAMINE HCL 25 MG PO CAPS: 25.0000 mg | ORAL_CAPSULE | Freq: Four times a day (QID) | ORAL | Status: DC | PRN

## 2015-07-05 MED ORDER — SIMETHICONE 80 MG PO CHEW
80.0000 mg | CHEWABLE_TABLET | ORAL | Status: DC
  Administered 2015-07-06 (×2): 80 mg via ORAL
  Filled 2015-07-05 (×2): qty 1

## 2015-07-05 MED ORDER — OXYTOCIN 40 UNITS IN LACTATED RINGERS INFUSION - SIMPLE MED: 62.5000 mL/h | INTRAVENOUS | Status: AC

## 2015-07-05 MED ORDER — NALBUPHINE HCL 10 MG/ML IJ SOLN
5.0000 mg | INTRAMUSCULAR | Status: DC | PRN
Start: 2015-07-05 — End: 2015-07-07

## 2015-07-05 MED ORDER — ZOLPIDEM TARTRATE 5 MG PO TABS: 5.0000 mg | ORAL_TABLET | Freq: Every evening | ORAL | Status: DC | PRN

## 2015-07-05 MED ORDER — ONDANSETRON HCL 4 MG/2ML IJ SOLN
INTRAMUSCULAR | Status: AC
  Filled 2015-07-05: qty 2

## 2015-07-05 MED ORDER — KETOROLAC TROMETHAMINE 30 MG/ML IJ SOLN
INTRAMUSCULAR | Status: AC
  Administered 2015-07-05: 30 mg via INTRAMUSCULAR
  Filled 2015-07-05: qty 1

## 2015-07-05 MED ORDER — SIMETHICONE 80 MG PO CHEW: 80.0000 mg | CHEWABLE_TABLET | ORAL | Status: DC | PRN

## 2015-07-05 MED ORDER — SERTRALINE HCL 100 MG PO TABS
100.0000 mg | ORAL_TABLET | Freq: Every day | ORAL | Status: DC
  Administered 2015-07-05 – 2015-07-07 (×3): 100 mg via ORAL
  Filled 2015-07-05 (×4): qty 1

## 2015-07-05 MED ORDER — FENTANYL CITRATE (PF) 100 MCG/2ML IJ SOLN
INTRAMUSCULAR | Status: AC
  Administered 2015-07-05: 25 ug via INTRAVENOUS
  Filled 2015-07-05: qty 2

## 2015-07-05 MED ORDER — NALOXONE HCL 0.4 MG/ML IJ SOLN: 0.4000 mg | INTRAMUSCULAR | Status: DC | PRN

## 2015-07-05 MED ORDER — LACTATED RINGERS IV SOLN: INTRAVENOUS | Status: DC

## 2015-07-05 MED ORDER — PHENYLEPHRINE 8 MG IN D5W 100 ML (0.08MG/ML) PREMIX OPTIME
INJECTION | INTRAVENOUS | Status: DC | PRN
  Administered 2015-07-05: 60 ug/min via INTRAVENOUS

## 2015-07-05 MED ORDER — MENTHOL 3 MG MT LOZG: 1.0000 | LOZENGE | OROMUCOSAL | Status: DC | PRN

## 2015-07-05 MED ORDER — SENNOSIDES-DOCUSATE SODIUM 8.6-50 MG PO TABS
2.0000 | ORAL_TABLET | ORAL | Status: DC
  Administered 2015-07-06 (×2): 2 via ORAL
  Filled 2015-07-05 (×2): qty 2

## 2015-07-05 MED ORDER — VITAMIN K1 1 MG/0.5ML IJ SOLN
INTRAMUSCULAR | Status: AC
  Filled 2015-07-05: qty 0.5

## 2015-07-05 MED ORDER — OXYCODONE-ACETAMINOPHEN 5-325 MG PO TABS
1.0000 | ORAL_TABLET | ORAL | Status: DC | PRN
  Administered 2015-07-05 – 2015-07-07 (×7): 1 via ORAL
  Filled 2015-07-05 (×7): qty 1

## 2015-07-05 MED ORDER — LANOLIN HYDROUS EX OINT
1.0000 | TOPICAL_OINTMENT | CUTANEOUS | Status: DC | PRN
Start: 2015-07-05 — End: 2015-07-07

## 2015-07-05 MED ORDER — BUSPIRONE HCL 15 MG PO TABS
30.0000 mg | ORAL_TABLET | Freq: Every day | ORAL | Status: DC
  Administered 2015-07-05 – 2015-07-06 (×2): 30 mg via ORAL
  Filled 2015-07-05 (×3): qty 2

## 2015-07-05 MED ORDER — SIMETHICONE 80 MG PO CHEW
80.0000 mg | CHEWABLE_TABLET | Freq: Three times a day (TID) | ORAL | Status: DC
  Administered 2015-07-05 – 2015-07-07 (×5): 80 mg via ORAL
  Filled 2015-07-05 (×5): qty 1

## 2015-07-05 MED ORDER — FENTANYL CITRATE (PF) 100 MCG/2ML IJ SOLN
INTRAMUSCULAR | Status: DC | PRN
  Administered 2015-07-05: 10 ug via INTRATHECAL

## 2015-07-05 MED ORDER — ONDANSETRON HCL 4 MG/2ML IJ SOLN: 4.0000 mg | Freq: Three times a day (TID) | INTRAMUSCULAR | Status: DC | PRN

## 2015-07-05 MED ORDER — SODIUM CHLORIDE 0.9 % IJ SOLN: 3.0000 mL | INTRAMUSCULAR | Status: DC | PRN

## 2015-07-05 MED ORDER — MORPHINE SULFATE (PF) 0.5 MG/ML IJ SOLN
INTRAMUSCULAR | Status: AC
  Filled 2015-07-05: qty 100

## 2015-07-05 MED ORDER — ERYTHROMYCIN 5 MG/GM OP OINT
TOPICAL_OINTMENT | OPHTHALMIC | Status: AC
  Filled 2015-07-05: qty 1

## 2015-07-05 MED ORDER — NALBUPHINE HCL 10 MG/ML IJ SOLN
INTRAMUSCULAR | Status: AC
  Administered 2015-07-05: 5 mg via SUBCUTANEOUS
  Filled 2015-07-05: qty 1

## 2015-07-05 SURGICAL SUPPLY — 30 items
BENZOIN TINCTURE PRP APPL 2/3 (GAUZE/BANDAGES/DRESSINGS) ×2 IMPLANT
CLAMP CORD UMBIL (MISCELLANEOUS) IMPLANT
CLOTH BEACON ORANGE TIMEOUT ST (SAFETY) ×2 IMPLANT
DRAPE SHEET LG 3/4 BI-LAMINATE (DRAPES) IMPLANT
DRSG OPSITE POSTOP 4X10 (GAUZE/BANDAGES/DRESSINGS) ×2 IMPLANT
DURAPREP 26ML APPLICATOR (WOUND CARE) ×2 IMPLANT
ELECT REM PT RETURN 9FT ADLT (ELECTROSURGICAL) ×2
ELECTRODE REM PT RTRN 9FT ADLT (ELECTROSURGICAL) ×1 IMPLANT
EXTRACTOR VACUUM KIWI (MISCELLANEOUS) IMPLANT
EXTRACTOR VACUUM M CUP 4 TUBE (SUCTIONS) IMPLANT
GLOVE BIO SURGEON STRL SZ 6 (GLOVE) ×2 IMPLANT
GLOVE BIOGEL PI IND STRL 6 (GLOVE) ×2 IMPLANT
GLOVE BIOGEL PI INDICATOR 6 (GLOVE) ×2
GOWN STRL REUS W/TWL LRG LVL3 (GOWN DISPOSABLE) ×4 IMPLANT
KIT ABG SYR 3ML LUER SLIP (SYRINGE) ×2 IMPLANT
LIQUID BAND (GAUZE/BANDAGES/DRESSINGS) IMPLANT
NEEDLE HYPO 25X5/8 SAFETYGLIDE (NEEDLE) ×2 IMPLANT
NS IRRIG 1000ML POUR BTL (IV SOLUTION) ×2 IMPLANT
PACK C SECTION WH (CUSTOM PROCEDURE TRAY) ×2 IMPLANT
PAD OB MATERNITY 4.3X12.25 (PERSONAL CARE ITEMS) ×2 IMPLANT
PENCIL SMOKE EVAC W/HOLSTER (ELECTROSURGICAL) ×2 IMPLANT
STRIP CLOSURE SKIN 1/2X4 (GAUZE/BANDAGES/DRESSINGS) ×4 IMPLANT
SUT CHROMIC 0 CTX 36 (SUTURE) ×6 IMPLANT
SUT MON AB 2-0 CT1 27 (SUTURE) ×2 IMPLANT
SUT PDS AB 0 CT1 27 (SUTURE) IMPLANT
SUT PLAIN 0 NONE (SUTURE) IMPLANT
SUT VIC AB 0 CT1 36 (SUTURE) IMPLANT
SUT VIC AB 4-0 KS 27 (SUTURE) IMPLANT
TOWEL OR 17X24 6PK STRL BLUE (TOWEL DISPOSABLE) ×2 IMPLANT
TRAY FOLEY CATH SILVER 14FR (SET/KITS/TRAYS/PACK) IMPLANT

## 2015-07-05 NOTE — Lactation Note (Signed)
This note was copied from the chart of Karen Yates [place]. Lactation Consultation Note  Patient Name: Karen Yates Deshaies [id number]'XToday's Date: 07/05/2015 Reason for consult: Follow-up assessment  Initial visit at 8 hours of life. Mom is a P3 who pumped & BO w/her 1st child (he had gastroschisis and was in the NICU). She nursed her 2nd child for 6-7 weeks, but was supplementing with formula, so she did not maintain a full supply. Mom will be staying at home & she plans on breastfeeding for longer.    Mom was doing antenatal expression at home. She had been hand-expressing her breast milk at bedtime over the last couple of weeks. Mom says she has about 9 oz collected at home. She gave a small amount earlier via a bottle. Mom reports that w/her 1st child, she had an ample supply.   Mom feels that "Everleigh" is breastfeeding well. She goes to the breast for short sessions. She latches w/relative ease (chin may need to be lowered to widen gape & top lip could be helped w/flanging somewhat). Swallows were easily noted w/the last feeding.    Mom is O+. Baby is A-.  Her 2nd baby had to go under bili lights for a very short period of time (about 1 hr per Mom).   Mom is taking Zoloft 100mg  qd (L1), Synthroid 75mcg (L1), and Buspar 30mg  qd. (L3). Mom given Lact[employer]'s hand-out and Thomas Hale's "Medications & Mother's Milk" (2014) info about Buspar. Mom has been taking the 30mg  all at once at bedtime. Mom encouraged to speak w/her provider about frequency/dosage.   Mom has my # to call if she'd like further assist this evening.      Lurline HareRichey, Dhwani Venkatesh [facility] 07/05/2015, 5:09 PM

## 2015-07-05 NOTE — Consult Note (Addendum)
[facility]  Delivery Note:  C-section       07/05/2015  8:41 AM  I was called to the operating room at the request of the patient's obstetrician (Dr. Morris) for a repeat c-section.  PRENATAL HX:  29 y/p G3P1011 at [redacted] weeks gestation.  Pregnancy complicated by hypothyroidism; well-controlled on Synthroid. Also, pt has Bipolar d/o on Buspar and Zoloft. She has had severe LBP during the third trimester and the patient has taken Percocet prn.    INTRAPARTUM HX:   Repeat c-section with AROM at delivery  DELIVERY:  Infant was vigorous at delivery, requiring no resuscitation other than standard warming, drying and stimulation.  APGARs 8 and 9.  Exam within normal limits.  After 5 minutes, baby left with nurse to assist parents with skin-to-skin care.   At ~10 minutes of age infant began to look cyanotic so NICU team called back to OR.  O2 saturation in 60's, blow by O2 applied for ~2 minutes and saturation improved to 80's.  Oxygen saturations then gradually improved over the next 5-10 minutes and were in the low 90's by 25 minutes of age.  Expect that this is delayed transition and should continue to get better.  She has not shown any signs of respiratory distress such as retractions or grunting.  Infant to do skin to skin with O2 saturation monitor.  If she has more cyanotic episodes or develops respiratory distress, NICU will re-evaluate.   _____________________ Electronically Signed By: Chuckie Mccathern, MD Neonatologist  

## 2015-07-05 NOTE — Addendum Note (Signed)
Addendum  created 07/05/15 1324 by Junious SilkMelinda Dailan Pfalzgraf, CRNA   Modules edited: Notes Section   Notes Section:  File: [id number]

## 2015-07-05 NOTE — Transfer of Care (Signed)
Immediate Anesthesia Transfer of Care Note  Patient: Karen Yates  Procedure(s) Performed: Procedure(s) with comments: CESAREAN SECTION (N/A) - Repeat edc 07/12/15   Patient Location: PACU  Anesthesia Type:Spinal  Level of Consciousness: awake, alert  and oriented  Airway & Oxygen Therapy: Patient Spontanous Breathing  Post-op Assessment: Report given to RN and Post -op Vital signs reviewed and stable  Post vital signs: Reviewed and stable  Last Vitals:  Filed Vitals:   07/05/15 0710  BP: 127/84  Pulse: 101  Temp: 36.8 C  Resp: 20    Complications: No apparent anesthesia complications

## 2015-07-05 NOTE — Anesthesia Postprocedure Evaluation (Signed)
  Anesthesia Post-op Note  Patient: Karen Yates  Procedure(s) Performed: Procedure(s) with comments: CESAREAN SECTION (N/A) - Repeat edc 07/12/15   Patient Location: PACU  Anesthesia Type:Spinal  Level of Consciousness: awake, alert  and oriented  Airway and Oxygen Therapy: Patient Spontanous Breathing  Post-op Pain: none  Post-op Assessment: Post-op Vital signs reviewed              Post-op Vital Signs: Reviewed  Last Vitals:  Filed Vitals:   07/05/15 1215  BP: 123/73  Pulse: 61  Temp: 36.8 C  Resp: 16    Complications: No apparent anesthesia complications

## 2015-07-05 NOTE — Op Note (Signed)
Karen SladeAmanda B Yates PROCEDURE DATE: 07/05/2015  PREOPERATIVE DIAGNOSIS: Intrauterine pregnancy at  5352w0d weeks gestation, previous C/S and desires repeat POSTOPERATIVE DIAGNOSIS: The same  PROCEDURE:   Repeat Low Transverse Cesarean Section  SURGEON:  Dr. Mitchel HonourMegan Debbera Wolken  INDICATIONS: Karen Slademanda B Yates is a 29 y.o. [id number] at 3052w0d scheduled for cesarean section secondary to desire for repeat.  The risks of cesarean section discussed with the patient included but were not limited to: bleeding which may require transfusion or reoperation; infection which may require antibiotics; injury to bowel, bladder, ureters or other surrounding organs; injury to the fetus; need for additional procedures including hysterectomy in the event of a life-threatening hemorrhage; placental abnormalities wth subsequent pregnancies, incisional problems, thromboembolic phenomenon and other postoperative/anesthesia complications. The patient concurred with the proposed plan, giving informed written consent for the procedure.    FINDINGS:  Viable female infant in cephalic presentation, APGARs 8,9:  Weight pending.  Clear amniotic fluid.  Intact placenta, three vessel cord.  Grossly normal uterus, ovaries and fallopian tubes. .   ANESTHESIA:    Spinal ESTIMATED BLOOD LOSS: 600 ml SPECIMENS: Placenta sent to L&D COMPLICATIONS: None immediate  PROCEDURE IN DETAIL:  The patient received intravenous antibiotics and had sequential compression devices applied to her lower extremities while in the preoperative area.  She was then taken to the operating room where spinal anesthesia was administered and was found to be adequate. She was then placed in a dorsal supine position with a leftward tilt, and prepped and draped in a sterile manner.  A foley catheter was placed into her bladder and attached to constant gravity.  After an adequate timeout was performed, a Pfannenstiel skin incision was made with scalpel and carried through to the  underlying layer of fascia. The fascia was incised in the midline and this incision was extended bilaterally using the Mayo scissors. Kocher clamps were applied to the superior aspect of the fascial incision and the underlying rectus muscles were dissected off bluntly. A similar process was carried out on the inferior aspect of the facial incision. The rectus muscles were separated in the midline bluntly and the peritoneum was entered bluntly.   Bladder flap was created sharply and developed bluntly.  Bladder blade was placed to protect bladder.  A transverse hysterotomy was made with a scalpel and extended bilaterally bluntly. The bladder blade was then removed. The infant was successfully delivered, and cord was clamped and cut and infant was handed over to awaiting neonatology team. Uterine massage was then administered and the placenta delivered intact with three-vessel cord. The uterus was cleared of clot and debris.  The hysterotomy was closed with 0 Chromic.  A second imbricating suture of 0-Chromic was used to reinforce the incision and aid in hemostasis.  The peritoneum and rectus muscles were noted to be hemostatic and were reapproximated using 2-0 Monocryl.  The fascia was closed with 0-PDS in a running fashion with good restoration of anatomy.  The subcutaneus tissue was copiously irrigated.  The skin was closed with 4-0 Vicryl in a subcuticular fasion.  Pt tolerated the procedure will.  All counts were correct x2.  Pt went to the recovery room in stable condition.

## 2015-07-05 NOTE — Progress Notes (Signed)
No change to H&P.  Else Habermann, DO 

## 2015-07-05 NOTE — Anesthesia Postprocedure Evaluation (Signed)
  Anesthesia Post-op Note  Patient: Karen Yates  Procedure(s) Performed: Procedure(s) with comments: CESAREAN SECTION (N/A) - Repeat edc 07/12/15   Patient Location: Mother/Baby  Anesthesia Type:Spinal  Level of Consciousness: awake, alert  and oriented  Airway and Oxygen Therapy: Patient Spontanous Breathing  Post-op Pain: none  Post-op Assessment: Post-op Vital signs reviewed, Patient's Cardiovascular Status Stable, Respiratory Function Stable, Patent Airway, No signs of Nausea or vomiting, Adequate PO intake and Pain level controlled              Post-op Vital Signs: Reviewed and stable  Last Vitals:  Filed Vitals:   07/05/15 1215  BP: 123/73  Pulse: 61  Temp: 36.8 C  Resp: 16    Complications: No apparent anesthesia complications

## 2015-07-05 NOTE — Anesthesia Procedure Notes (Signed)
Spinal Patient location during procedure: OR Start time: 07/05/2015 8:20 AM End time: 07/05/2015 8:23 AM Staffing Anesthesiologist: Suella Broad D Performed by: anesthesiologist  Preanesthetic Checklist Completed: patient identified, site marked, surgical consent, pre-op evaluation, timeout performed, IV checked, risks and benefits discussed and monitors and equipment checked Spinal Block Patient position: sitting Prep: DuraPrep Patient monitoring: heart rate, continuous pulse ox, blood pressure and cardiac monitor Approach: midline Location: L4-5 Injection technique: single-shot Needle Needle type: Whitacre and Introducer  Needle gauge: 24 G Needle length: 9 cm Additional Notes Negative paresthesia. Negative blood return. Positive free-flowing CSF. Expiration date of kit checked and confirmed. Patient tolerated procedure well, without complications.

## 2015-07-05 NOTE — Anesthesia Preprocedure Evaluation (Addendum)
Anesthesia Evaluation  Patient identified by MRN, date of birth, ID band Patient awake    Reviewed: Allergy & Precautions, NPO status , Patient's Chart, lab work & pertinent test results  History of Anesthesia Complications (+) PONV  Airway Mallampati: II  TM Distance: >3 FB Neck ROM: Full    Dental  (+) Teeth Intact   Pulmonary asthma , former smoker,    breath sounds clear to auscultation       Cardiovascular negative cardio ROS   Rhythm:Regular Rate:Normal     Neuro/Psych PSYCHIATRIC DISORDERS Bipolar Disorder negative neurological ROS     GI/Hepatic Neg liver ROS, GERD  Medicated,  Endo/Other  Hypothyroidism   Renal/GU   negative genitourinary   Musculoskeletal negative musculoskeletal ROS (+)   Abdominal   Peds negative pediatric ROS (+)  Hematology negative hematology ROS (+)   Anesthesia Other Findings   Reproductive/Obstetrics (+) Pregnancy                            Lab Results  Component Value Date   WBC 9.5 07/04/2015   HGB 10.5* 07/04/2015   HCT 30.9* 07/04/2015   MCV 82.8 07/04/2015   PLT 252 07/04/2015   No results found for: INR, PROTIME   Anesthesia Physical Anesthesia Plan  ASA: III  Anesthesia Plan: Spinal   Post-op Pain Management:    Induction:   Airway Management Planned:   Additional Equipment:   Intra-op Plan:   Post-operative Plan:   Informed Consent: I have reviewed the patients History and Physical, chart, labs and discussed the procedure including the risks, benefits and alternatives for the proposed anesthesia with the patient or authorized representative who has indicated his/her understanding and acceptance.   Dental advisory given  Plan Discussed with: CRNA  Anesthesia Plan Comments:         Anesthesia Quick Evaluation

## 2015-07-06 ENCOUNTER — Encounter (HOSPITAL_COMMUNITY): Payer: Self-pay | Admitting: *Deleted

## 2015-07-06 LAB — CBC
HCT: 28.2 % — ABNORMAL LOW (ref 36.0–46.0)
HEMOGLOBIN: 9.2 g/dL — AB (ref 12.0–15.0)
MCH: 27.1 pg (ref 26.0–34.0)
MCHC: 32.6 g/dL (ref 30.0–36.0)
MCV: 83.2 fL (ref 78.0–100.0)
Platelets: 241 10*3/uL (ref 150–400)
RBC: 3.39 MIL/uL — AB (ref 3.87–5.11)
RDW: 15 % (ref 11.5–15.5)
WBC: 11.8 10*3/uL — AB (ref 4.0–10.5)

## 2015-07-06 LAB — BIRTH TISSUE RECOVERY COLLECTION (PLACENTA DONATION)

## 2015-07-06 NOTE — Clinical Social Work Maternal (Signed)
CLINICAL SOCIAL WORK MATERNAL/CHILD NOTE  Patient Details  Name: Karen Yates MRN: [id number] Date of Birth: 06/10/1986  Date:  07/06/2015  Clinical Social Worker Initiating Note:  Loleta BooksSarah Kaceton Vieau MSW, LCSW and Kandis CockingYazmin Rodriguez, Kenard GowerBSW, MSW student  Date/ Time Initiated:  07/06/15/0930     Child's Name:  Karen Yates   Legal Guardian:  Langston MaskerAmanda Figeroa and Janyth PupaNicholas   Need for Interpreter:  None   Date of Referral:  04/12/2015     Reason for Referral:  Behavioral Health Issues, including SI    [facility]   Address:  [address] [place], [place] [address]  Phone number:  [phone]   Household Members:  Minor Children, Significant Other   Natural Supports (not living in the home):  Extended Family, Immediate Family   Professional Supports: Previously received medication management from Dr. Marjo [place]ickerhild[place] in [place], but is unable to continue with this psychiatrist due to change in insurance.  MOB identified her PCP as a professional support who will be able to continue prescribing her medications postpartum.   Employment: Homemaker   Type of Work:   N/A  Education:    N/A  Architectinancial Resources:  Medicaid   Other Resources:      Cultural/Religious Considerations Which May Impact Care:  None reported  Strengths:  Ability to meet basic needs , Home prepared for child , Understanding of illness, Pediatrician chosen    Risk Factors/Current Problems:   1)Mental Health Concerns: MOB presents with history of anxiety, depression, and bipolar. MOB endorsed presence of symptoms since high school. She stated that she has been stable on medications for past 2 years. MOB stated that when she began to try to conceive the infant, medications were changed to Zoloft and Buspar. MOB denied acute mental health symptoms or concerns with the change in medication, and discussed intention to continue with current medications postpartum.    Cognitive State:  Able to  Concentrate , Alert , Goal Oriented , Linear Thinking , Insightful    Mood/Affect:  Happy , Animated, Comfortable , Calm , Bright , Interested    CSW Assessment:  Received request for consult due to MOB presenting with a diagnosis of anxiety, depression, and bipolar.  MOB provided consent for the FOB to remain in the room during the assessment. The MOB and FOB presented as easily engaged and receptive to the visit. The MOB remained calm during the assessment when the infant started to cry. She openly discussed her mental health history, and voiced an awareness of the importance of supporting her mental health as she transitions postpartum. No acute mental health symptoms observed or noted in her thought process.   Per MOB, she endorsed feelings of happiness secondary to the infant's birth.  She stated that she feels "great", and reported belief that she is transitioning well postpartum. MOB discussed eagerness and readiness to be discharged, and shared that she is recovering well from her C-section, and feels comfortable and confident with breastfeeding.  MOB reported presence of a strong support system, and having the home prepared for the infant. MOB endorsed normative concerns about support her 2 other children (ages 496 and 4) to the infant, but expressed confidence that they will transition well.    MOB endorsed history of anxiety, depression, and bipolar since adolescence. She reported that she has extensive history participating in therapy and medication management as a teenager, and shared that she feels confident with her current emotional regulation skills. MOB discussed how  she had been on medication as a teenager, but discontinued use at age 19.  She shared that approximately 2 1/2 years, she restarted psychotropic medications, and reported belief that her mental health has been "stable" ever since.  MOB reported that she has previously been receiving mental health care with Dr. Childs  in [place], but recently learned that this practice does not accept her insurance.  MOB declined need for new referral for psychiatry since she feels confident in her PCP prescribing her medications for her.  Prior to the pregnancy, she was prescribed Cymbalta, Buspar, and Topamax, but stated that her medications were changed to Buspar and Zoloft when she attempted to conceive the infant. She endorsed normative symptoms that accompany discontinuation of medication, but reported that she has been stable and feels "better" on her current medications. Per MOB, she intends to continue with her current prescriptions postpartum.  MOB endorsed history of PPD after her first child was born, but discussed impressions that it was related to her age, the infant's 6 week NICU admission, and minimal support. MOB denied PPD after her second child was born, and denied concerns about her transition postpartum with this infant. MOB presented with insight on importance of supporting her mental health, and discussed an awareness about her increased risk for developing symptoms due to her prior history. MOB reported goal of notifying her medical providers if she notes onset of symptoms postpartum.  MOB and FOB denied additional questions, concerns, or needs at this time. MOB expressed appreciation for the visit and support, and agreed to contact CSW if needs arise during the admission.  CSW Plan/Description:   1)Patient/Family Education: Perinatal mood disorders 2)Information/Referral to Community Resources: Feelings After Birth support group  3)No Further Intervention Required/No Barriers to Discharge    Charlena Haub N, LCSW 07/06/2015, 10:32 AM  

## 2015-07-06 NOTE — Progress Notes (Signed)
Subjective: Postpartum Day 1: Cesarean Delivery Patient reports tolerating PO and no problems voiding.    Objective: Vital signs in last 24 hours: Temp:  [97.9 F (36.6 C)-98.4 F (36.9 C)] 98.2 F (36.8 C) (10/20 0404) Pulse Rate:  [54-91] 67 (10/19 2349) Resp:  [15-20] 20 (10/20 0404) BP: (110-142)/(60-79) 117/73 mmHg (10/20 0404) SpO2:  [97 %-100 %] 98 % (10/20 0404)  Physical Exam:  General: alert and cooperative Lochia: appropriate Uterine Fundus: firm Incision: healing well, reported that honeycomb dressing was saturated and change this am, no current bleeding observed DVT Evaluation: No evidence of DVT seen on physical exam. Negative Homan's sign. No cords or calf tenderness.   Recent Labs  07/04/15 1015 07/06/15 0520  HGB 10.5* 9.2*  HCT 30.9* 28.2*    Assessment/Plan: Status post Cesarean section. Doing well postoperatively.  Continue current care.  Derra Shartzer G 07/06/2015, 8:31 AM

## 2015-07-06 NOTE — Lactation Note (Signed)
This note was copied from the chart of Karen Yates Coye. Lactation Consultation Note  Patient Name: Karen Yates Ercole ZOXWR'UToday's Date: 07/06/2015 Reason for consult: Follow-up assessment;Difficult latch Experienced bf mom that requested help with latch/fussy baby. Went over baby behavior day 2 and soothing. Mom was using the football position, switched baby to cross cradle and she latched easily. Audible swallows for about 12 minutes then baby went to sleep. Encouraged mom to stay calm and keep trying. She is aware of O/P lactation and support group.     Maternal Data    Feeding Feeding Type: Breast Fed Length of feed: 12 min  LATCH Score/Interventions Latch: Repeated attempts needed to sustain latch, nipple held in mouth throughout feeding, stimulation needed to elicit sucking reflex. Intervention(s): Adjust position;Assist with latch  Audible Swallowing: Spontaneous and intermittent Intervention(s): Skin to skin Intervention(s): Alternate breast massage  Type of Nipple: Everted at rest and after stimulation  Comfort (Breast/Nipple): Soft / non-tender     Hold (Positioning): Assistance needed to correctly position infant at breast and maintain latch. Intervention(s): Support Pillows;Position options  LATCH Score: 8  Lactation Tools Discussed/Used     Consult Status Consult Status: Follow-up Date: 07/07/15 Follow-up type: In-patient    Rulon Eisenmengerlizabeth E Bobette Leyh 07/06/2015, 4:44 PM

## 2015-07-07 LAB — TYPE AND SCREEN
ABO/RH(D): O POS
Antibody Screen: NEGATIVE
UNIT DIVISION: 0
UNIT DIVISION: 0
Unit division: 0
Unit division: 0

## 2015-07-07 MED ORDER — OXYCODONE-ACETAMINOPHEN 5-325 MG PO TABS: 1.0000 | ORAL_TABLET | ORAL | Status: DC | PRN

## 2015-07-07 MED ORDER — IBUPROFEN 600 MG PO TABS: 600.0000 mg | ORAL_TABLET | Freq: Four times a day (QID) | ORAL | Status: DC

## 2015-07-07 NOTE — Discharge Summary (Signed)
Obstetric Discharge Summary Reason for Admission: cesarean section Prenatal Procedures: ultrasound Intrapartum Procedures: cesarean: low cervical, transverse Postpartum Procedures: none Complications-Operative and Postpartum: none HEMOGLOBIN  Date Value Ref Range Status  07/06/2015 9.2* 12.0 - 15.0 g/dL Final   HCT  Date Value Ref Range Status  07/06/2015 28.2* 36.0 - 46.0 % Final    Physical Exam:  General: alert and cooperative Lochia: appropriate Uterine Fundus: firm Incision: healing well DVT Evaluation: No evidence of DVT seen on physical exam. Negative Homan's sign. No cords or calf tenderness.  Discharge Diagnoses: Term Pregnancy-delivered  Discharge Information: Date: 07/07/2015 Activity: pelvic rest Diet: routine Medications: PNV, Ibuprofen, Percocet and synthroid and Zoloft Condition: stable Instructions: refer to practice specific booklet Discharge to: home   Newborn Data: Live born female  Birth Weight: 8 lb 13.1 oz (4000 g) APGAR: 8, 9  Home with mother.  Pike Scantlebury G 07/07/2015, 9:05 AM

## 2016-03-08 ENCOUNTER — Ambulatory Visit: Payer: BLUE CROSS/BLUE SHIELD | Admitting: Internal Medicine

## 2016-05-08 ENCOUNTER — Ambulatory Visit (HOSPITAL_COMMUNITY)
Admission: RE | Admit: 2016-05-08 | Discharge: 2016-05-08 | Disposition: A | Discharge status: Home / Self Care | Payer: BLUE CROSS/BLUE SHIELD | Attending: Psychiatry | Admitting: Psychiatry

## 2016-05-08 ENCOUNTER — Emergency Department (HOSPITAL_COMMUNITY)
Admission: EM | Admit: 2016-05-08 | Discharge: 2016-05-09 | Payer: Medicaid Other | Attending: Emergency Medicine | Admitting: Emergency Medicine

## 2016-05-08 ENCOUNTER — Encounter: Payer: Self-pay | Admitting: Emergency Medicine

## 2016-05-08 DIAGNOSIS — F1019 Alcohol abuse with unspecified alcohol-induced disorder: Secondary | ICD-10-CM | POA: Diagnosis present

## 2016-05-08 DIAGNOSIS — F1220 Cannabis dependence, uncomplicated: Secondary | ICD-10-CM | POA: Insufficient documentation

## 2016-05-08 DIAGNOSIS — Z87891 Personal history of nicotine dependence: Secondary | ICD-10-CM | POA: Diagnosis not present

## 2016-05-08 DIAGNOSIS — F332 Major depressive disorder, recurrent severe without psychotic features: Secondary | ICD-10-CM

## 2016-05-08 DIAGNOSIS — F319 Bipolar disorder, unspecified: Secondary | ICD-10-CM | POA: Diagnosis not present

## 2016-05-08 DIAGNOSIS — F419 Anxiety disorder, unspecified: Secondary | ICD-10-CM | POA: Diagnosis not present

## 2016-05-08 DIAGNOSIS — F1924 Other psychoactive substance dependence with psychoactive substance-induced mood disorder: Secondary | ICD-10-CM | POA: Diagnosis present

## 2016-05-08 DIAGNOSIS — J45909 Unspecified asthma, uncomplicated: Secondary | ICD-10-CM | POA: Insufficient documentation

## 2016-05-08 DIAGNOSIS — E039 Hypothyroidism, unspecified: Secondary | ICD-10-CM | POA: Insufficient documentation

## 2016-05-08 DIAGNOSIS — Z79899 Other long term (current) drug therapy: Secondary | ICD-10-CM | POA: Insufficient documentation

## 2016-05-08 DIAGNOSIS — F1010 Alcohol abuse, uncomplicated: Secondary | ICD-10-CM | POA: Diagnosis present

## 2016-05-08 DIAGNOSIS — D649 Anemia, unspecified: Secondary | ICD-10-CM | POA: Diagnosis not present

## 2016-05-08 DIAGNOSIS — F1994 Other psychoactive substance use, unspecified with psychoactive substance-induced mood disorder: Secondary | ICD-10-CM | POA: Insufficient documentation

## 2016-05-08 DIAGNOSIS — F3181 Bipolar II disorder: Secondary | ICD-10-CM | POA: Diagnosis not present

## 2016-05-08 DIAGNOSIS — F329 Major depressive disorder, single episode, unspecified: Secondary | ICD-10-CM | POA: Diagnosis not present

## 2016-05-08 DIAGNOSIS — F1290 Cannabis use, unspecified, uncomplicated: Secondary | ICD-10-CM | POA: Insufficient documentation

## 2016-05-08 LAB — COMPREHENSIVE METABOLIC PANEL
ALBUMIN: 4.8 g/dL (ref 3.5–5.0)
ALT: 26 U/L (ref 14–54)
ANION GAP: 7 (ref 5–15)
AST: 24 U/L (ref 15–41)
Alkaline Phosphatase: 59 U/L (ref 38–126)
BILIRUBIN TOTAL: 0.4 mg/dL (ref 0.3–1.2)
BUN: 10 mg/dL (ref 6–20)
CHLORIDE: 108 mmol/L (ref 101–111)
CO2: 22 mmol/L (ref 22–32)
Calcium: 9.4 mg/dL (ref 8.9–10.3)
Creatinine, Ser: 0.61 mg/dL (ref 0.44–1.00)
GFR calc Af Amer: >60 mL/min (ref >60)
GLUCOSE: 105 mg/dL — AB (ref 65–99)
POTASSIUM: 3.7 mmol/L (ref 3.5–5.1)
Sodium: 137 mmol/L (ref 135–145)
TOTAL PROTEIN: 8 g/dL (ref 6.5–8.1)

## 2016-05-08 LAB — CBC
HCT: 38.4 % (ref 36.0–46.0)
Hemoglobin: 12.8 g/dL (ref 12.0–15.0)
MCH: 28.5 pg (ref 26.0–34.0)
MCHC: 33.3 g/dL (ref 30.0–36.0)
MCV: 85.5 fL (ref 78.0–100.0)
PLATELETS: 383 10*3/uL (ref 150–400)
RBC: 4.49 MIL/uL (ref 3.87–5.11)
RDW: 14.1 % (ref 11.5–15.5)
WBC: 12.7 10*3/uL — AB (ref 4.0–10.5)

## 2016-05-08 LAB — ETHANOL

## 2016-05-08 LAB — RAPID URINE DRUG SCREEN, HOSP PERFORMED
Amphetamines: NOT DETECTED
BENZODIAZEPINES: NOT DETECTED
Barbiturates: NOT DETECTED
COCAINE: NOT DETECTED
Opiates: NOT DETECTED
Tetrahydrocannabinol: NOT DETECTED

## 2016-05-08 LAB — SALICYLATE LEVEL: Salicylate Lvl: <4 mg/dL (ref 2.8–30.0)

## 2016-05-08 LAB — ACETAMINOPHEN LEVEL

## 2016-05-08 MED ORDER — TOPIRAMATE 25 MG PO TABS
50.0000 mg | ORAL_TABLET | Freq: Every day | ORAL | Status: DC
  Administered 2016-05-09: 50 mg via ORAL
  Filled 2016-05-08: qty 2

## 2016-05-08 MED ORDER — LORAZEPAM 1 MG PO TABS
1.0000 mg | ORAL_TABLET | Freq: Three times a day (TID) | ORAL | Status: DC | PRN
  Administered 2016-05-09 (×2): 1 mg via ORAL
  Filled 2016-05-08 (×2): qty 1

## 2016-05-08 MED ORDER — ACETAMINOPHEN 325 MG PO TABS: 650.0000 mg | ORAL_TABLET | ORAL | Status: DC | PRN

## 2016-05-08 MED ORDER — LEVOTHYROXINE SODIUM 88 MCG PO TABS
88.0000 ug | ORAL_TABLET | Freq: Every day | ORAL | Status: DC
  Administered 2016-05-09: 88 ug via ORAL
  Filled 2016-05-08 (×2): qty 1

## 2016-05-08 MED ORDER — KETOROLAC TROMETHAMINE 60 MG/2ML IM SOLN
60.0000 mg | Freq: Once | INTRAMUSCULAR | Status: AC
  Administered 2016-05-09: 60 mg via INTRAMUSCULAR
  Filled 2016-05-08: qty 2

## 2016-05-08 MED ORDER — BUSPIRONE HCL 10 MG PO TABS
30.0000 mg | ORAL_TABLET | Freq: Every day | ORAL | Status: DC
  Administered 2016-05-09: 30 mg via ORAL
  Filled 2016-05-08: qty 3

## 2016-05-08 MED ORDER — ALBUTEROL SULFATE HFA 108 (90 BASE) MCG/ACT IN AERS: 2.0000 | INHALATION_SPRAY | Freq: Four times a day (QID) | RESPIRATORY_TRACT | Status: DC | PRN

## 2016-05-08 MED ORDER — ONDANSETRON HCL 4 MG PO TABS: 4.0000 mg | ORAL_TABLET | Freq: Three times a day (TID) | ORAL | Status: DC | PRN

## 2016-05-08 MED ORDER — NICOTINE 21 MG/24HR TD PT24
21.0000 mg | MEDICATED_PATCH | Freq: Every day | TRANSDERMAL | Status: DC
  Filled 2016-05-08: qty 1

## 2016-05-08 MED ORDER — ZOLPIDEM TARTRATE 5 MG PO TABS
5.0000 mg | ORAL_TABLET | Freq: Every evening | ORAL | Status: DC | PRN
  Administered 2016-05-09: 5 mg via ORAL
  Filled 2016-05-08: qty 1

## 2016-05-08 MED ORDER — DULOXETINE HCL 60 MG PO CPEP
60.0000 mg | ORAL_CAPSULE | Freq: Every day | ORAL | Status: DC
  Administered 2016-05-09: 60 mg via ORAL
  Filled 2016-05-08: qty 1

## 2016-05-08 NOTE — ED Triage Notes (Signed)
[facility] for suicidal ideation and not feeling safe to be by herself. Pt was sent here for inpatient treatment [facility]. Alert and oriented.
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
## 2016-05-09 ENCOUNTER — Encounter (HOSPITAL_COMMUNITY): Payer: Self-pay | Admitting: *Deleted

## 2016-05-09 ENCOUNTER — Inpatient Hospital Stay (HOSPITAL_COMMUNITY)
Admission: AD | Admit: 2016-05-09 | Discharge: 2016-05-17 | DRG: 885 | Disposition: A | Discharge status: Home / Self Care | Payer: BLUE CROSS/BLUE SHIELD | Source: Intra-hospital | Attending: Psychiatry | Admitting: Psychiatry

## 2016-05-09 DIAGNOSIS — F1020 Alcohol dependence, uncomplicated: Secondary | ICD-10-CM | POA: Diagnosis present

## 2016-05-09 DIAGNOSIS — F329 Major depressive disorder, single episode, unspecified: Secondary | ICD-10-CM | POA: Diagnosis present

## 2016-05-09 DIAGNOSIS — Z87891 Personal history of nicotine dependence: Secondary | ICD-10-CM | POA: Diagnosis not present

## 2016-05-09 DIAGNOSIS — F333 Major depressive disorder, recurrent, severe with psychotic symptoms: Secondary | ICD-10-CM | POA: Diagnosis present

## 2016-05-09 DIAGNOSIS — F3181 Bipolar II disorder: Secondary | ICD-10-CM | POA: Diagnosis present

## 2016-05-09 DIAGNOSIS — R45851 Suicidal ideations: Secondary | ICD-10-CM | POA: Diagnosis present

## 2016-05-09 DIAGNOSIS — G4700 Insomnia, unspecified: Secondary | ICD-10-CM | POA: Diagnosis present

## 2016-05-09 DIAGNOSIS — F1010 Alcohol abuse, uncomplicated: Secondary | ICD-10-CM | POA: Diagnosis present

## 2016-05-09 DIAGNOSIS — F332 Major depressive disorder, recurrent severe without psychotic features: Secondary | ICD-10-CM | POA: Diagnosis present

## 2016-05-09 MED ORDER — TOPIRAMATE 25 MG PO TABS
50.0000 mg | ORAL_TABLET | Freq: Every day | ORAL | Status: DC
  Administered 2016-05-09 – 2016-05-10 (×2): 50 mg via ORAL
  Filled 2016-05-09 (×5): qty 2

## 2016-05-09 MED ORDER — ALUM & MAG HYDROXIDE-SIMETH 200-200-20 MG/5ML PO SUSP
30.0000 mL | ORAL | Status: DC | PRN
  Administered 2016-05-15 – 2016-05-16 (×2): 30 mL via ORAL
  Filled 2016-05-09 (×2): qty 30

## 2016-05-09 MED ORDER — MAGNESIUM HYDROXIDE 400 MG/5ML PO SUSP: 30.0000 mL | Freq: Every day | ORAL | Status: DC | PRN

## 2016-05-09 MED ORDER — IBUPROFEN 600 MG PO TABS
600.0000 mg | ORAL_TABLET | Freq: Four times a day (QID) | ORAL | Status: DC | PRN
  Administered 2016-05-09 – 2016-05-13 (×6): 600 mg via ORAL
  Filled 2016-05-09 (×6): qty 1

## 2016-05-09 MED ORDER — TRAZODONE HCL 50 MG PO TABS
50.0000 mg | ORAL_TABLET | Freq: Every evening | ORAL | Status: DC | PRN
  Administered 2016-05-09: 50 mg via ORAL
  Filled 2016-05-09: qty 1

## 2016-05-09 MED ORDER — DULOXETINE HCL 60 MG PO CPEP
60.0000 mg | ORAL_CAPSULE | Freq: Every day | ORAL | Status: DC
  Administered 2016-05-09 – 2016-05-13 (×5): 60 mg via ORAL
  Filled 2016-05-09 (×9): qty 1

## 2016-05-09 MED ORDER — BUSPIRONE HCL 15 MG PO TABS
30.0000 mg | ORAL_TABLET | Freq: Every day | ORAL | Status: DC
  Administered 2016-05-09: 30 mg via ORAL
  Filled 2016-05-09: qty 2
  Filled 2016-05-09: qty 6
  Filled 2016-05-09 (×2): qty 2

## 2016-05-09 MED ORDER — ACETAMINOPHEN 325 MG PO TABS
650.0000 mg | ORAL_TABLET | ORAL | Status: DC | PRN
  Administered 2016-05-13 – 2016-05-15 (×2): 650 mg via ORAL
  Filled 2016-05-09 (×2): qty 2

## 2016-05-09 MED ORDER — LEVOTHYROXINE SODIUM 88 MCG PO TABS
88.0000 ug | ORAL_TABLET | Freq: Every day | ORAL | Status: DC
  Administered 2016-05-10 – 2016-05-17 (×8): 88 ug via ORAL
  Filled 2016-05-09 (×10): qty 1

## 2016-05-09 MED ORDER — NICOTINE 21 MG/24HR TD PT24
21.0000 mg | MEDICATED_PATCH | Freq: Every day | TRANSDERMAL | Status: DC
  Filled 2016-05-09 (×2): qty 1

## 2016-05-09 MED ORDER — ALBUTEROL SULFATE HFA 108 (90 BASE) MCG/ACT IN AERS
2.0000 | INHALATION_SPRAY | Freq: Four times a day (QID) | RESPIRATORY_TRACT | Status: DC | PRN
  Administered 2016-05-15: 2 via RESPIRATORY_TRACT
  Filled 2016-05-09: qty 6.7

## 2016-05-09 MED ORDER — LORAZEPAM 1 MG PO TABS
1.0000 mg | ORAL_TABLET | Freq: Three times a day (TID) | ORAL | Status: DC | PRN
  Administered 2016-05-10: 1 mg via ORAL
  Filled 2016-05-09: qty 1

## 2016-05-09 MED ORDER — ONDANSETRON HCL 4 MG PO TABS: 4.0000 mg | ORAL_TABLET | Freq: Three times a day (TID) | ORAL | Status: DC | PRN

## 2016-05-09 NOTE — Tx Team (Signed)
Initial Treatment Plan 05/09/2016 4:36 PM Karen Slademanda B Racanelli ZOX:[id number]    PATIENT STRESSORS: Financial difficulties Loss of kids (moved away to another State) Substance abuse   PATIENT STRENGTHS: Ability for insight Average or above average intelligence Communication skills Motivation for treatment/growth Physical Health Supportive family/friends   PATIENT IDENTIFIED PROBLEMS: At risk for suicide  Psychosis  Depression  "Get a better grip on reality"  "To not feel threatened around general public"             DISCHARGE CRITERIA:  Ability to meet basic life and health needs Improved stabilization in mood, thinking, and/or behavior Motivation to continue treatment in a less acute level of care Need for constant or close observation no longer present Reduction of life-threatening or endangering symptoms to within safe limits  PRELIMINARY DISCHARGE PLAN: Outpatient therapy Return to previous living arrangement  PATIENT/FAMILY INVOLVEMENT: This treatment plan has been presented to and reviewed with the patient, Karen Yates.  The patient and family have been given the opportunity to ask questions and make suggestions.  Carleene OverlieMiddleton, Maisee Vollman P, RN 05/09/2016, 4:36 PM
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
## 2016-05-09 NOTE — BHH Group Notes (Signed)
Adult Psychoeducational Group Note  Date:  05/09/2016 Time:  9:03 PM  Group Topic/Focus:  Wrap-Up Group:   The focus of this group is to help patients review their daily goal of treatment and discuss progress on daily workbooks.   Participation Level:  Minimal  Participation Quality:  Inattentive and Resistant  Affect:  Depressed, Flat, Resistant and Tearful  Cognitive:  Alert  Insight: Lacking  Engagement in Group:  Poor and Resistant  Modes of Intervention:  Discussion  Additional Comments: Pt refused to share. [place] HunWatlington, Rayni Nemitz A 05/09/2016, 9:03 PM

## 2016-05-09 NOTE — Progress Notes (Signed)
D: Pt passive SI-contracts for safety.  denies /HI/AVH. Pt is pleasant and cooperative. Pt stated she was just getting adjusted to coming here. Pt stated she had high anxiety earlier in the day.   A: Pt was offered support and encouragement. Pt was given scheduled medications. Pt was encourage to attend groups. Q 15 minute checks were done for safety.   R: Pt is taking medication .Pt receptive to treatment and safety maintained on unit.

## 2016-05-09 NOTE — ED Notes (Signed)
On the phone 

## 2016-05-09 NOTE — ED Notes (Signed)
Up to the bathroom 

## 2016-05-09 NOTE — BH Assessment (Signed)
BHH Assessment Progress Note   [facility]00 on [facility]/24.  Dr. Jama Flavorsobos will be attending psychiatrist.

## 2016-05-09 NOTE — ED Notes (Addendum)
[facility].  Husband brought clothing for pt (wanded by security), given to pehlam

## 2016-05-09 NOTE — ED Notes (Signed)
OK to transport per Eliot Fordrika, Pehlam contacted

## 2016-05-09 NOTE — ED Notes (Addendum)
CSW into see 

## 2016-05-09 NOTE — BH Assessment (Signed)
BHH Assessment Progress Note  Per Donell SievertSpencer Simon, PA, this pt requires psychiatric hospitalization at this time.  Clint Bolderori Beck, RN, [facility]; they will be ready to receive pt at 11:00.  Pt has signed Voluntary Admission and Consent for Treatment, as well as Consent to Release Information to her PCP and to her husband, and signed forms [facility].  Pt's nurse,, Wille CelesteJanie, has been notified, and agrees to send original paperwork along with pt via Juel [place]row[place]ham, and to call report to [phone].  Doylene Canninghomas Nikola Marone, MA Triage Specialist [phone]

## 2016-05-09 NOTE — ED Notes (Addendum)
Pt presents with SI, plan to take OD of pills.  Pt reports she doesn't feel safe at home.  Denies HI or AVH.  Feeling hopeless.  Pt reports she drinks 5-6 beers per day.  Awake, alert & responsive, no distress noted.  Monitoring for safety, Q 15 min checks in effect.  Diagnosed with Bipolar DO, Anxiety and Depression in the past.

## 2016-05-09 NOTE — ED Notes (Signed)
Pt's husband into see 

## 2016-05-09 NOTE — ED Notes (Signed)
Pt requesting  Medication for anxiety

## 2016-05-09 NOTE — ED Notes (Addendum)
Pt's husband took belongings home. Pt was wanded by security.

## 2016-05-10 DIAGNOSIS — R45851 Suicidal ideations: Secondary | ICD-10-CM

## 2016-05-10 DIAGNOSIS — F1020 Alcohol dependence, uncomplicated: Secondary | ICD-10-CM

## 2016-05-10 DIAGNOSIS — F3181 Bipolar II disorder: Secondary | ICD-10-CM

## 2016-05-10 MED ORDER — HYDROXYZINE HCL 25 MG PO TABS
25.0000 mg | ORAL_TABLET | Freq: Four times a day (QID) | ORAL | Status: AC | PRN
  Administered 2016-05-11 – 2016-05-12 (×3): 25 mg via ORAL
  Filled 2016-05-10 (×4): qty 1

## 2016-05-10 MED ORDER — LOPERAMIDE HCL 2 MG PO CAPS
2.0000 mg | ORAL_CAPSULE | ORAL | Status: AC | PRN
Start: 2016-05-10 — End: 2016-05-13

## 2016-05-10 MED ORDER — RISPERIDONE 0.25 MG PO TABS
0.2500 mg | ORAL_TABLET | Freq: Every day | ORAL | Status: DC
  Administered 2016-05-10 – 2016-05-13 (×4): 0.25 mg via ORAL
  Filled 2016-05-10 (×6): qty 1

## 2016-05-10 MED ORDER — CHLORDIAZEPOXIDE HCL 25 MG PO CAPS
25.0000 mg | ORAL_CAPSULE | ORAL | Status: AC
  Administered 2016-05-12 (×2): 25 mg via ORAL
  Filled 2016-05-10 (×2): qty 1

## 2016-05-10 MED ORDER — VITAMIN B-1 100 MG PO TABS
100.0000 mg | ORAL_TABLET | Freq: Every day | ORAL | Status: DC
  Administered 2016-05-11 – 2016-05-17 (×7): 100 mg via ORAL
  Filled 2016-05-10 (×9): qty 1

## 2016-05-10 MED ORDER — ADULT MULTIVITAMIN W/MINERALS CH
1.0000 | ORAL_TABLET | Freq: Every day | ORAL | Status: DC
  Administered 2016-05-10 – 2016-05-17 (×8): 1 via ORAL
  Filled 2016-05-10 (×10): qty 1

## 2016-05-10 MED ORDER — THIAMINE HCL 100 MG/ML IJ SOLN
100.0000 mg | Freq: Once | INTRAMUSCULAR | Status: AC
Start: 2016-05-10 — End: 2016-05-10
  Administered 2016-05-10: 100 mg via INTRAMUSCULAR
  Filled 2016-05-10: qty 2

## 2016-05-10 MED ORDER — CHLORDIAZEPOXIDE HCL 25 MG PO CAPS
25.0000 mg | ORAL_CAPSULE | Freq: Four times a day (QID) | ORAL | Status: AC
  Administered 2016-05-10 (×4): 25 mg via ORAL
  Filled 2016-05-10 (×4): qty 1

## 2016-05-10 MED ORDER — ONDANSETRON 4 MG PO TBDP: 4.0000 mg | ORAL_TABLET | Freq: Four times a day (QID) | ORAL | Status: AC | PRN

## 2016-05-10 MED ORDER — CHLORDIAZEPOXIDE HCL 25 MG PO CAPS
25.0000 mg | ORAL_CAPSULE | Freq: Four times a day (QID) | ORAL | Status: AC | PRN
  Administered 2016-05-12: 25 mg via ORAL
  Filled 2016-05-10: qty 1

## 2016-05-10 MED ORDER — CHLORDIAZEPOXIDE HCL 25 MG PO CAPS
25.0000 mg | ORAL_CAPSULE | Freq: Every day | ORAL | Status: AC
  Administered 2016-05-13: 25 mg via ORAL
  Filled 2016-05-10: qty 1

## 2016-05-10 MED ORDER — TRAZODONE HCL 50 MG PO TABS
50.0000 mg | ORAL_TABLET | Freq: Every day | ORAL | Status: DC
  Administered 2016-05-10 – 2016-05-16 (×7): 50 mg via ORAL
  Filled 2016-05-10 (×9): qty 1

## 2016-05-10 MED ORDER — DIVALPROEX SODIUM ER 250 MG PO TB24
750.0000 mg | ORAL_TABLET | Freq: Every day | ORAL | Status: DC
  Administered 2016-05-10 – 2016-05-16 (×7): 750 mg via ORAL
  Filled 2016-05-10 (×9): qty 3

## 2016-05-10 MED ORDER — CHLORDIAZEPOXIDE HCL 25 MG PO CAPS
25.0000 mg | ORAL_CAPSULE | Freq: Three times a day (TID) | ORAL | Status: AC
  Administered 2016-05-11 (×3): 25 mg via ORAL
  Filled 2016-05-10 (×3): qty 1
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
## 2016-05-10 NOTE — Progress Notes (Signed)
Patient denies SI, HI, and AVH, but reported feeling detached from reality.  Patient states that she feels like she is just going along for a ride in her body and someone else is controlling her actions. Patient states she feels like the food isn't real.   Patient stated that this is the first time that she has felt this before but never like this.   Assess patient for safety, offer medications as prescribed, engage patient in 1:1 staff talk  Continue to monitor for safety, offer medications as prescribed.  

## 2016-05-10 NOTE — [facility] Group Notes (Signed)
[facility] LCSW Group Therapy  05/10/2016 5:33 PM  Type of Therapy:  Group Therapy  Participation Level:  Did Not Attend  Modes of Intervention:  Discussion, Education, Socialization and Support  Summary of Progress/Problems: Feelings around Relapse. Group members discussed the meaning of relapse and shared personal stories of relapse, how it affected them and others, and how they perceived themselves during this time. Group members were encouraged to identify triggers, warning signs and coping skills used when facing the possibility of relapse. Social supports were discussed and explored in detail.  Juliano Mceachin L Keyante Durio MSW, LCSWA  05/10/2016, 5:33 PM   
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
## 2016-05-10 NOTE — Tx Team (Signed)
Interdisciplinary Treatment and Diagnostic Plan Update  05/10/2016 Time of Session: 1:42 PM  Karen Yates MRN: [id number]  Principal Diagnosis: Bipolar II disorder ([place])  Secondary Diagnoses: Principal Problem:   Bipolar II disorder (Karen Yates) Active Problems:   Major depressive disorder, recurrent episode, severe, with psychosis (Karen Yates)   Alcohol use disorder, severe, dependence (Karen Yates)   Current Medications:  Current Facility-Administered Medications  Medication Dose Route Frequency Provider Last Rate Last Dose  . acetaminophen (TYLENOL) tablet 650 mg  650 mg Oral Q4H PRN [name], NP      . albuterol (PROVENTIL HFA;VENTOLIN HFA) 108 (90 Base) MCG/ACT inhaler 2 puff  2 puff Inhalation Q6H PRN [name], NP      . alum & mag hydroxide-simeth (MAALOX/MYLANTA) 200-200-20 MG/5ML suspension 30 mL  30 mL Oral Q4H PRN [name], NP      . chlordiazePOXIDE (LIBRIUM) capsule 25 mg  25 mg Oral Q6H PRN [name], MD      . chlordiazePOXIDE (LIBRIUM) capsule 25 mg  25 mg Oral QID [name], MD   25 mg at 05/10/16 1216   Followed by  . [START ON 05/11/2016] chlordiazePOXIDE (LIBRIUM) capsule 25 mg  25 mg Oral TID [name], MD       Followed by  . [START ON 05/12/2016] chlordiazePOXIDE (LIBRIUM) capsule 25 mg  25 mg Oral BH-qamhs [name], MD       Followed by  . [START ON 05/13/2016] chlordiazePOXIDE (LIBRIUM) capsule 25 mg  25 mg Oral Daily [name], MD      . divalproex (DEPAKOTE ER) 24 hr tablet 750 mg  750 mg Oral QHS [name], MD      . DULoxetine (CYMBALTA) DR capsule 60 mg  60 mg Oral Daily [name], NP   60 mg at 05/10/16 4098  . hydrOXYzine (ATARAX/VISTARIL) tablet 25 mg  25 mg Oral Q6H PRN [name], MD      . ibuprofen (ADVIL,MOTRIN) tablet 600 mg  600 mg Oral Q6H PRN [name], NP   600 mg at 05/09/16 2305  . levothyroxine (SYNTHROID, LEVOTHROID) tablet 88 mcg  88 mcg Oral QAC breakfast [name], NP   88 mcg at 05/10/16 0816  .  loperamide (IMODIUM) capsule 2-4 mg  2-4 mg Oral PRN [name], MD      . magnesium hydroxide (MILK OF MAGNESIA) suspension 30 mL  30 mL Oral Daily PRN [name], NP      . multivitamin with minerals tablet 1 tablet  1 tablet Oral Daily [name], MD   1 tablet at 05/10/16 1100  . ondansetron (ZOFRAN-ODT) disintegrating tablet 4 mg  4 mg Oral Q6H PRN [name], MD      . risperiDONE (RISPERDAL) tablet 0.25 mg  0.25 mg Oral Daily [name], MD   0.25 mg at 05/10/16 1100  . [START ON 05/11/2016] thiamine (VITAMIN B-1) tablet 100 mg  100 mg Oral Daily [name], MD      . traZODone (DESYREL) tablet 50 mg  50 mg Oral QHS [name], MD        PTA Medications: Prescriptions Prior to Admission  Medication Sig Dispense Refill Last Dose  . albuterol (PROVENTIL HFA;VENTOLIN HFA) 108 (90 BASE) MCG/ACT inhaler Inhale 2 puffs into the lungs every 6 (six) hours as needed for wheezing or shortness of breath.   Past Month at Unknown time  . busPIRone (BUSPAR) 10 MG tablet Take 30 mg by  mouth at bedtime.   05/07/2016 at Unknown time  . diphenhydrAMINE (BENADRYL) 25 mg capsule Take 25 mg by mouth every 6 (six) hours as needed for sleep.   05/07/2016 at Unknown time  . DULoxetine (CYMBALTA) 60 MG capsule Take 60 mg by mouth daily.   05/07/2016 at Unknown time  . levothyroxine (SYNTHROID, LEVOTHROID) 88 MCG tablet Take 88 mcg by mouth daily before breakfast.   05/08/2016 at Unknown time  . topiramate (TOPAMAX) 50 MG tablet Take 50 mg by mouth daily.   05/07/2016 at 2100  . ibuprofen (ADVIL,MOTRIN) 600 MG tablet Take 1 tablet (600 mg total) by mouth every 6 (six) hours. (Patient not taking: Reported on 05/08/2016) 30 tablet 1 Not Taking at Unknown time    Treatment Modalities: Medication Management, Group therapy, Case management,  1 to 1 session with clinician, Psychoeducation, Recreational therapy.   Physician Treatment Plan for Primary Diagnosis: Bipolar II disorder (Karen Yates) Long Term Goal(s):  Improvement in symptoms so as ready for discharge  Short Term Goals: Ability to disclose and discuss suicidal ideas, Ability to identify and develop effective coping behaviors will improve and Ability to identify triggers associated with substance abuse/mental health issues will improve  Medication Management: Evaluate patient's response, side effects, and tolerance of medication regimen.  Therapeutic Interventions: 1 to 1 sessions, Unit Group sessions and Medication administration.  Evaluation of Outcomes: Not Met  Physician Treatment Plan for Secondary Diagnosis: Principal Problem:   Bipolar II disorder ([name]) Active Problems:   Major depressive disorder, recurrent episode, severe, with psychosis (Karen Yates)   Alcohol use disorder, severe, dependence (Karen Yates)   Long Term Goal(s): Improvement in symptoms so as ready for discharge  Short Term Goals: Ability to verbalize feelings will improve, Ability to disclose and discuss suicidal ideas and Ability to demonstrate self-control will improve  Medication Management: Evaluate patient's response, side effects, and tolerance of medication regimen.  Therapeutic Interventions: 1 to 1 sessions, Unit Group sessions and Medication administration.  Evaluation of Outcomes: Not Met   RN Treatment Plan for Primary Diagnosis: Bipolar II disorder (Karen Yates) Long Term Goal(s): Knowledge of disease and therapeutic regimen to maintain health will improve  Short Term Goals: Ability to demonstrate self-control, Ability to disclose and discuss suicidal ideas and Ability to identify and develop effective coping behaviors will improve  Medication Management: RN will administer medications as ordered by provider, will assess and evaluate patient's response and provide education to patient for prescribed medication. RN will report any adverse and/or side effects to prescribing provider.  Therapeutic Interventions: 1 on 1 counseling sessions, Psychoeducation, Medication  administration, Evaluate responses to treatment, Monitor vital signs and CBGs as ordered, Perform/monitor CIWA, COWS, AIMS and Fall Risk screenings as ordered, Perform wound care treatments as ordered.  Evaluation of Outcomes: Not Met   LCSW Treatment Plan for Primary Diagnosis: Bipolar II disorder ([place]) Long Term Goal(s): Safe transition to appropriate next level of care at discharge, Engage patient in therapeutic group addressing interpersonal concerns.  Short Term Goals: Engage patient in aftercare planning with referrals and resources, Identify triggers associated with mental health/substance abuse issues and Increase skills for wellness and recovery  Therapeutic Interventions: Assess for all discharge needs, 1 to 1 time with Social worker, Explore available resources and support systems, Assess for adequacy in community support network, Educate family and significant other(s) on suicide prevention, Complete Psychosocial Assessment, Interpersonal group therapy.  Evaluation of Outcomes: Not Met   Progress in Treatment: Attending groups: Pt is new to milieu, continuing to assess  Participating in groups: Pt is new to milieu, continuing to assess  Taking medication as prescribed: Yes, MD continues to assess for medication changes as needed Toleration medication: Yes, no side effects reported at this time Family/Significant other contact made: No, CSW assessing for appropriate contact Patient understands diagnosis: Yes AEB seeking help with substance abuse and depression Discussing patient identified problems/goals with staff: Yes Medical problems stabilized or resolved: Yes Denies suicidal/homicidal ideation: Yes Issues/concerns per patient self-inventory: None Other: N/A  New problem(s) identified: None identified at this time.   New Short Term/Long Term Goal(s): None identified at this time.   Discharge Plan or Barriers: Pt will return home and follow-up with outpatient services.    Reason for Continuation of Hospitalization: Depression Hallucinations Medication stabilization Suicidal ideation Withdrawal symptoms  Estimated Length of Stay: 3-5 days  Attendees: Patient: 05/10/2016  1:42 PM  Physician: Dr. Modesta Messing 05/10/2016  1:42 PM  Nursing: Idell Pickles, RN 05/10/2016  1:42 PM  RN Care Manager: Lars Pinks, RN 05/10/2016  1:42 PM  Social Worker: Peri Maris, LCSW; Kristin Drinkard, LCSW 05/10/2016  1:42 PM  Recreational Therapist:  05/10/2016  1:42 PM  Other: Lindell Spar, NP; Samuel Jester, NP 05/10/2016  1:42 PM  Other:  05/10/2016  1:42 PM  Other: 05/10/2016  1:42 PM    Scribe for Treatment Team: Bo Mcclintock, LCSW 05/10/2016 1:42 PM

## 2016-05-10 NOTE — Progress Notes (Signed)
Recreation Therapy Notes  Date: 05/10/16 Time: 0945 Location: 300 Hall Group Room  Group Topic: Stress Management  Goal Area(s) Addresses:  Patient will verbalize importance of using healthy stress management.  Patient will identify positive emotions associated with healthy stress management.   Intervention: Stress Management  Activity :  Progressive Muscle Relaxation.  LRT introduced the technique of progressive muscle relaxation to patients.  Patients were to follow along as LRT read script to engaged in the technique.  Education:  Stress Management, Discharge Planning.   Education Outcome: Acknowledges edcuation/In group clarification offered/Needs additional education  Clinical Observations/Feedback: Pt did not attend group.    Caroll RancherMarjette Byran Bilotti, LRT/CTRS    Caroll RancherLindsay, Marcee Jacobs A 05/10/2016 12:56 PM

## 2016-05-10 NOTE — Progress Notes (Signed)
D.  Pt pleasant on approach, denies complaints at this time.  Pt had fallen during recreation group earlier today but didn't report it until much later.  Pt denied hitting her head and day shift RN contacted doctor who did come and see Pt.  He told Pt to make staff aware if she notices any other injuries besides her knee which had an abrasion.  Doctor did not wish for VS to continue to be taken other than as previous CIWA protocol.   Pt in dayroom at this time playing cards with peers, no distress noted, VSS.  Pt denies SI/HI/hallucinations at this time.  A.  Support and encouragement offered, medications given as ordered  R.  Pt remains safe, will continue to monitor.
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
## 2016-05-10 NOTE — Progress Notes (Signed)
The patient attended this evening's A. A. Meeting and was appropriate.  

## 2016-05-11 ENCOUNTER — Encounter (HOSPITAL_COMMUNITY): Payer: Self-pay | Admitting: Registered Nurse

## 2016-05-11 DIAGNOSIS — F333 Major depressive disorder, recurrent, severe with psychotic symptoms: Secondary | ICD-10-CM

## 2016-05-11 DIAGNOSIS — F1020 Alcohol dependence, uncomplicated: Secondary | ICD-10-CM

## 2016-05-11 LAB — LIPID PANEL
CHOL/HDL RATIO: 4.4 ratio
CHOLESTEROL: 198 mg/dL (ref 0–200)
HDL: 45 mg/dL (ref >40)
LDL CALC: 121 mg/dL — AB (ref 0–99)
TRIGLYCERIDES: 161 mg/dL — AB (ref <150)
VLDL: 32 mg/dL (ref 0–40)

## 2016-05-11 LAB — PREGNANCY, URINE: Preg Test, Ur: NEGATIVE

## 2016-05-11 NOTE — [facility] Group Notes (Signed)
[facility] Group Notes:  (Clinical Social Work)   07/15/2015     10:00-11:00AM  Summary of Progress/Problems:   In today's process group there was a discussion of healthy versus unhealthy coping techniques.  Each patient shared their story of what led to their hospitalization, identifying the unhealthy coping mechanisms that made the problem worse.  They were then asked to identify where they think they are in their recovery journal currently.  Finally, patients took turns doing role plays, being a person in their life that has somehow been injured by their unhealthy coping technique(s) and responding to group questions as that person.  This caused quite an emotional reaction in the group, with tearfulness being noted in a number of patients. The patient reported that she is in the hospital because she has a drinking problem and has been having a lot of moments "out of reality," where she is losing time.  She has been self-harming in order to "bring myself back to reality."  She identified both the drinking and the self-harm as unhealthy coping techniques.  She participated in the roleplay as her 7yo son and was very tearful in pretending to be him, seeing how her drinking has an impact on him. Her children are coming to see her tonight and she received advice from the group on how to present herself positively.  Type of Therapy:  Group Therapy - Process   Participation Level:  Active  Participation Quality:  Appropriate, Attentive, Sharing and Supportive  Affect:  Blunted  Cognitive:  Appropriate and Oriented  Insight:  Engaged  Engagement in Therapy:  Engaged  Modes of Intervention:  Education, Motivational Interviewing  Ambrose MantleMareida Grossman-Orr, LCSW 05/11/2016, 12:24 PM

## 2016-05-11 NOTE — Progress Notes (Signed)
D.  Pt pleasant on approach, denies complaints other than some continued knee pain.  Pt was positive for evening AA group, interacting appropriately with peers on the unit.  Observed playing cards in dayroom.  Pt denies SI/HI/hallucinations at this time.  A.  Support and encouragement offered, medication given as ordered for left knee pain.   R.  Pt remains safe on the unit, will continue to monitor.

## 2016-05-11 NOTE — Progress Notes (Signed)
The patient attended this evening's A. A. Meeting and was appropriate.  

## 2016-05-11 NOTE — [facility] Group Notes (Signed)
[facility] Group Notes:  (Nursing---Healthy Coping Skills)  Date:  05/11/2016  Time:  0900 Type of Therapy:  Nurse Education  Participation Level:  Active  Participation Quality:  Attentive, Sharing and Supportive  Affect:  Anxious  Cognitive:  Alert and Oriented  Insight:  Appropriate, Good  Engagement in Group:  Engaged and Supportive  Modes of Intervention:  Discussion, Education, Orientation and Support  Summary of Progress/Problems:Pt attended scheduled groups and was engaged throughout this session.  Ouida SillsWesseh, Lincoln Maxinlivette 05/11/2016, 0900

## 2016-05-11 NOTE — Plan of Care (Signed)
Problem: Activity: Goal: Interest or engagement in leisure activities will improve Outcome: Progressing Pt attended group on unit

## 2016-05-11 NOTE — Progress Notes (Signed)
D: Pt visible in milieu majority of this shift. Denies SI, HI and AVH at time of assessment. Rated her depression 7/10, hopelessness 3/10 and anxiety 6/10. Pt's stated her goal for today is to "stay focused on a task, really try to concentrate on one thing at a time". Continues to report feeling detached from herself, but told writer "It's getting better, I'll be fine".  A: Scheduled and PRN (Motrin for c/o L. knee pain & Vistaril) medications administered as prescribed. Support and encouragement provided to pt. Q 15 minutes checks maintained for safety.  R: Pt has been cooperative with unit routines. Denies active withdrawal symptoms at present. Attends and participate in scheduled groups. Tolerates all PO intake well. Compliant with medications when offered, denies adverse drug reactions at present. Remains safe on and off unit. POC continues.
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
## 2016-05-12 LAB — HEMOGLOBIN A1C
Hgb A1c MFr Bld: 5 % (ref 4.8–5.6)
MEAN PLASMA GLUCOSE: 97 mg/dL

## 2016-05-12 NOTE — Progress Notes (Signed)
D.  Pt pleasant but anxious on approach, denies other complaints at this time.  Positive for evening AA group, observed interacting appropriately with peers on unit.  States knee pain is improving.  Pt denies SI/HI/hallucinations at this time.  A.  Support and encouragement offered  R.  Pt remains safe on the unit, will continue to monitor.

## 2016-05-12 NOTE — Progress Notes (Signed)
D: Pt endorsed anxiety this afternoon, stated to writer "I feel panicky, like I'm having an anxiety attack". Pt also reported poor sleep last night. Denies SI, HI and AVH when assessed. Pt rated her depression 3/10, hopelessness 2/10 and anxiety 6/10 on self inventory sheet. Stated her goal for today is "staying focused on goals". Visible in groups on unit this shift. A: Emotional support and availability provided to pt. Scheduled and PRN (Vistaril) medications administered as ordered. Pt encouraged to voice concerns. Q 15 safety checks maintained without incidents thus far. R:Pt attended scheduled groups. Compliant with medications as ordered; denies adverse drug reactions when assessed. Remains safe on and off unit this shift. POC continues.

## 2016-05-12 NOTE — [facility] Group Notes (Signed)
[facility] Group Notes:  (Nursing/MHT/Case Management/Adjunct)  Date:  05/12/2016  Time:  10:14 AM  Type of Therapy:  Nurse Education  Participation Level:  Active  Participation Quality:  Appropriate  Affect:  Appropriate  Cognitive:  Appropriate  Insight:  Appropriate  Engagement in Group:  Engaged  Modes of Intervention:  Education  Summary of Progress/Problems: Karen Yates shared that her goal was to try to continue staying positive and grounded.  Maurine SimmeringShugart, Kinlee Garrison M 05/12/2016, 10:14 AM

## 2016-05-12 NOTE — [facility] Group Notes (Signed)
[facility] Group Notes:  (Clinical Social Work)  05/12/2016  10:00-11:00AM  Summary of Progress/Problems:   The main focus of today's process group was to   1)  discuss the importance of adding supports  2)  Identify the patient's current healthy supports and start thinking about what could be added             3)  Play a song that each patient feels inspired by, allowing time for explanations and reactions.  The patient expressed full comprehension of the concepts presented, and agreed that there is a need to add more supports.  The patient was very open to the idea of using music as a means of distraction from triggers.  She stated her husband has been supportive, so she already knew that, but her in-laws have surprised her with the amount of support they are giving.  She wants to add Alcoholics Anonymous meetings as well as find a church that aligns with her beliefs.  Type of Therapy:  Process Group with Motivational Interviewing  Participation Level:  Active  Participation Quality:  Appropriate, Attentive, Sharing and Supportive  Affect:  Appropriate  Cognitive:  Alert, Appropriate and Oriented  Insight:  Engaged  Engagement in Therapy:  Engaged  Modes of Intervention:   Education, Support and Processing, Activity  Ambrose MantleMareida Grossman-Orr, LCSW 05/12/2016

## 2016-05-12 NOTE — Progress Notes (Signed)
Pt attended the evening AA meeting.
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
## 2016-05-13 LAB — PROLACTIN: Prolactin: 45.4 ng/mL — ABNORMAL HIGH (ref 4.8–23.3)

## 2016-05-13 MED ORDER — HYDROXYZINE HCL 50 MG PO TABS
50.0000 mg | ORAL_TABLET | Freq: Four times a day (QID) | ORAL | Status: DC | PRN
Start: 2016-05-13 — End: 2016-05-15
  Administered 2016-05-13 – 2016-05-15 (×5): 50 mg via ORAL
  Filled 2016-05-13 (×4): qty 1

## 2016-05-13 MED ORDER — RISPERIDONE 0.25 MG PO TABS
0.2500 mg | ORAL_TABLET | Freq: Once | ORAL | Status: AC
  Administered 2016-05-13: 0.25 mg via ORAL
  Filled 2016-05-13 (×2): qty 1

## 2016-05-13 MED ORDER — RISPERIDONE 0.5 MG PO TABS
0.5000 mg | ORAL_TABLET | Freq: Every day | ORAL | Status: DC
  Administered 2016-05-14: 0.5 mg via ORAL
  Filled 2016-05-13 (×3): qty 1

## 2016-05-13 MED ORDER — HYDROXYZINE HCL 50 MG PO TABS
ORAL_TABLET | ORAL | Status: AC
  Filled 2016-05-13: qty 1

## 2016-05-13 MED ORDER — DULOXETINE HCL 30 MG PO CPEP
90.0000 mg | ORAL_CAPSULE | Freq: Every day | ORAL | Status: DC
  Administered 2016-05-14 – 2016-05-17 (×4): 90 mg via ORAL
  Filled 2016-05-13 (×6): qty 3
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
## 2016-05-13 NOTE — [facility] Group Notes (Signed)
[facility]  Date/Time: 05/13/2016  1:30pm  Type of Therapy and Topic:  Group Therapy:  Trust and Honesty  Participation Level:  Active  Description of Group:    In this group patients will be asked to explore value of being honest.  Patients will be guided to discuss their thoughts, feelings, and behaviors related to honesty and trusting in others. Patients will process together how trust and honesty relate to how we form relationships with peers, family members, and self. Each patient will be challenged to identify and express feelings of being vulnerable. Patients will discuss reasons why people are dishonest and identify alternative outcomes if one was truthful (to self or others).  This group will be process-oriented, with patients participating in exploration of their own experiences as well as giving and receiving support and challenge from other group members.  Therapeutic Goals: 1. Patient will identify why honesty is important to relationships and how honesty overall affects relationships.  2. Patient will identify a situation where they lied or were lied too and the  feelings, thought process, and behaviors surrounding the situation 3. Patient will identify the meaning of being vulnerable, how that feels, and how that correlates to being honest with self and others. 4. Patient will identify situations where they could have told the truth, but instead lied and explain reasons of dishonesty.  Summary of Patient Progress  Patient expressed the importance of honesty in healthy relationships and discussed the ways in which lying impacts her relationships with others. She discussed the importance of setting boundaries with others, particularly her mother. She acknowledges the balance of being honest with being cautious of who she shares personal information with in her life.    Therapeutic Modalities:   Cognitive Behavioral Therapy Solution Focused Therapy Motivational  Interviewing Brief Therapy   Samuella BruinKristin Camaria Gerald, LCSW Clinical Social Worker [facility] [phone]

## 2016-05-13 NOTE — Progress Notes (Signed)
Nursing Note 05/13/2016 [id number]  Data Reports sleeping 2 with/without PRN sleep med.  Rates depression 2/10, hopelessness 7/10, and anxiety 4/10. Affect flat mood "anxious."  Denies HI, SI, AVH; agrees to come to staff before acting on any harmful thoughts.  Patient reports anxiety this morning after breakfast "it just comes out of nowhere.  I think it was being around all of those people I don't know in the cafeteria.  I'm okay with the people down this hallway." C/O left knee stiffness and pain. Had another episode after lunch "I just felt like I was outside of my body, and I couldn't do anything."  Action Coached to do deep breathing during anxiety attack after breakfast, given vistaril. Treatment team updated with patient status this AM. Spoke with NP after lunch after speaking with patient, received PRN vistaril order, given to patient.  MD notified of patient's episodes.Spoke with patient 1:1, nurse offered support to patient throughout shift.  Left knee dressing changed, abrasion cleansed with sterile NS, scant serous drainage observed.  Given ice pack to apply and tylenol for left knee pain.  Continues to be monitored on 15 minute checks for safety.  Response Vistaril effective, patient reported improvement in symptoms afterwards.  Remains safe and appropriate on unit.

## 2016-05-13 NOTE — Plan of Care (Signed)
Problem: Safety: Goal: Periods of time without injury will increase Outcome: Progressing No new injuries reported or observed

## 2016-05-13 NOTE — Progress Notes (Signed)
Recreation Therapy Notes  Date: 05/13/16 Time: 0930 Location: [address] Dayroom  Group Topic: Stress Management  Goal Area(s) Addresses:  Patient will verbalize importance of using healthy stress management.  Patient will identify positive emotions associated with healthy stress management.   Behavioral Response: Engaged  Intervention: Stress Management  Activity :  [place].  LRT introduced to the technique of guided imagery to the patients.  Patients were to follow along as LRT read script in order to participate in the the technique.  Education:  Stress Management, Discharge Planning.   Education Outcome: Acknowledges edcuation/In group clarification offered/Needs additional education  Clinical Observations/Feedback: Pt attended group.    Caroll RancherMarjette Elliemae Braman, LRT/CTRS    Caroll RancherLindsay, Niels Cranshaw A 05/13/2016 12:38 PM

## 2016-05-14 MED ORDER — RISPERIDONE 0.5 MG PO TABS
0.5000 mg | ORAL_TABLET | Freq: Every day | ORAL | Status: DC
  Administered 2016-05-14: 0.5 mg via ORAL
  Filled 2016-05-14 (×2): qty 1

## 2016-05-14 NOTE — Progress Notes (Signed)
Recreation Therapy Notes   Animal-Assisted Activity (AAA) Program Checklist/Progress Notes Patient Eligibility Criteria Checklist & Daily Group note for Rec TxIntervention  Date: 08.29.2017 Time: 2:45pm Location: [address] Dayroom   AAA/T Program Assumption of Risk Form signed by Patient/ or Parent Legal Guardian Yes  Patient is free of allergies or sever asthma Yes  Patient reports no fear of animals Yes  Patient reports no history of cruelty to animals Yes  Patient understands his/her participation is voluntary Yes  Behavioral Response: Did not attend.   Cherisse Carrell L Doyce Stonehouse, LRT/CTRS  Karen Yates L 05/14/2016 3:00 PM 

## 2016-05-14 NOTE — Plan of Care (Signed)
Problem: Education: Goal: Knowledge of the prescribed therapeutic regimen will improve Outcome: Progressing Pt able to verbalzie that she is taking medication to reduce to her hallucinations

## 2016-05-14 NOTE — [facility] Group Notes (Signed)
[facility] Group Notes:  (Nursing/MHT/Case Management/Adjunct)  Date:  05/14/2016  Time:  12:50 PM  Type of Therapy:  Nurse Education  Participation Level:  Active  Participation Quality:  Appropriate, Sharing and Supportive  Affect:  Appropriate  Cognitive:  Appropriate  Insight:  Appropriate  Engagement in Group:  Developing/Improving and Supportive  Modes of Intervention:  Discussion  Summary of Progress/Problems:  Patient actively participated in groups.    Jerrye BushyLaRonica R Gita Dilger 05/14/2016, 12:50 PM

## 2016-05-14 NOTE — [facility] Group Notes (Signed)
Pt attended spiritual care group on grief and loss facilitated by chaplain Burnis KingfisherMatthew Eutha Cude   Group opened with brief discussion and psycho-social ed around grief and loss in relationships and in relation to self - identifying life patterns, circumstances, changes that cause losses. Established group norm of speaking from own life experience. Group goal of establishing open and affirming space for members to share loss and experience with grief, normalize grief experience and provide psycho social education and grief support.     Group facilitation drew on Narrative, Adlerian and brief CBT

## 2016-05-14 NOTE — Progress Notes (Signed)
Patient ID: Karen Yates, female   DOB: [birth year], 30 y.o.   MRN: [id number]  Pt currently presents with an anxious affect and behavior. Pt states "I has a good visit, my husband was here and he brought me a picture of my three kids." Pt reports good sleep with current medication regimen. Pt reports that she has felt sedated by the medications she took today.   Pt provided with medications per providers orders. Pt's labs and vitals were monitored throughout the night. Pt supported emotionally and encouraged to express concerns and questions. Pt educated on medications.  Pt's safety ensured with 15 minute and environmental checks. Pt currently denies SI/HI and auditory hallucinations. Pt verbally agrees to seek staff if SI/HI or A/VH occurs and to consult with staff before acting on any harmful thoughts. Will continue POC.

## 2016-05-14 NOTE — Progress Notes (Signed)
Patient ID: Karen Yates, female   DOB: November 20, 1985, 30 y.o.   MRN: [id number] Patient endorses positive AV but denies VH and SI.  Patient reports increased anxiety and states "I feel like I am going along for a ride in my body, like I can tell what is real and what is not real."  Patient was reported having a blank expression on her face.    Assess patient for safety, offer medications as prescribed, engage patient in 1:1 staff talk.   Continue to monitor as prescribed, Patient able to contract for safety.

## 2016-05-14 NOTE — [facility] Group Notes (Signed)
[facility] LCSW Group Therapy 05/14/2016 1:15 PM Type of Therapy: Group Therapy Participation Level: Active  Participation Quality: Attentive, Sharing and Supportive  Affect: Blunted  Cognitive: Alert and Oriented  Insight: Developing/Improving and Engaged  Engagement in Therapy: Developing/Improving and Engaged  Modes of Intervention: Activity, Clarification, Confrontation, Discussion, Education, Exploration, Limit-setting, Orientation, Problem-solving, Rapport Building, Dance movement psychotherapisteality Testing, Socialization and Support  Summary of Progress/Problems: Patient was attentive and engaged with speaker from [employer]. Patient was attentive to speaker while they shared their story of dealing with mental health and overcoming it. Patient expressed interest in their programs and services and received information on their agency. Patient processed ways they can relate to the speaker.   Samuella BruinKristin Daemon Dowty, LCSW Clinical Social Worker [facility] [phone]

## 2016-05-14 NOTE — Progress Notes (Signed)
[facility] Group Notes:  (Nursing/MHT/Case Management/Adjunct)  Date:  05/14/2016  Time:  10:34 PM  Type of Therapy:  Psychoeducational Skills  Participation Level:  Active  Participation Quality:  Appropriate  Affect:  Appropriate  Cognitive:  Appropriate  Insight:  Improving  Engagement in Group:  Engaged  Modes of Intervention:  Discussion  Summary of Progress/Problems: Patient stated that she had an up and down day. "Hard time being present with reality" Saying being social helps, going outside, and paying cards. Patient says she "feels spiritual being outdoors." Would like to take vistaril upon request only. Patient stated that the increase of depakote "helps." Patient is not ready to leave, but would like outpatient treatment on a weekly basis. Client plans to also attend AA once discharged.   Lyndee HensenGoins, Karen Yates R 05/14/2016, 10:34 PM
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
## 2016-05-15 LAB — HEPATIC FUNCTION PANEL
ALBUMIN: 4.1 g/dL (ref 3.5–5.0)
ALT: 18 U/L (ref 14–54)
AST: 17 U/L (ref 15–41)
Alkaline Phosphatase: 47 U/L (ref 38–126)
Bilirubin, Direct: <0.1 mg/dL — ABNORMAL LOW (ref 0.1–0.5)
TOTAL PROTEIN: 7.2 g/dL (ref 6.5–8.1)
Total Bilirubin: 0.3 mg/dL (ref 0.3–1.2)

## 2016-05-15 LAB — CBC
HCT: 35.9 % — ABNORMAL LOW (ref 36.0–46.0)
HEMOGLOBIN: 11.9 g/dL — AB (ref 12.0–15.0)
MCH: 29.4 pg (ref 26.0–34.0)
MCHC: 33.1 g/dL (ref 30.0–36.0)
MCV: 88.6 fL (ref 78.0–100.0)
Platelets: 382 10*3/uL (ref 150–400)
RBC: 4.05 MIL/uL (ref 3.87–5.11)
RDW: 14.7 % (ref 11.5–15.5)
WBC: 8.1 10*3/uL (ref 4.0–10.5)

## 2016-05-15 LAB — VALPROIC ACID LEVEL: Valproic Acid Lvl: 47 ug/mL — ABNORMAL LOW (ref 50.0–100.0)

## 2016-05-15 MED ORDER — RISPERIDONE 0.5 MG PO TABS
0.5000 mg | ORAL_TABLET | Freq: Every day | ORAL | Status: DC
  Administered 2016-05-16: 0.5 mg via ORAL
  Filled 2016-05-15 (×2): qty 1

## 2016-05-15 MED ORDER — HYDROXYZINE HCL 25 MG PO TABS
25.0000 mg | ORAL_TABLET | Freq: Four times a day (QID) | ORAL | Status: DC | PRN
Start: 2016-05-15 — End: 2016-05-17
  Administered 2016-05-15 – 2016-05-17 (×7): 25 mg via ORAL
  Filled 2016-05-15 (×7): qty 1

## 2016-05-15 MED ORDER — BACITRACIN-NEOMYCIN-POLYMYXIN 400-5-5000 EX OINT
TOPICAL_OINTMENT | CUTANEOUS | Status: AC
  Administered 2016-05-15: 22:00:00
  Filled 2016-05-15: qty 1

## 2016-05-15 NOTE — Tx Team (Signed)
Interdisciplinary Treatment and Diagnostic Plan Update  05/15/2016 Time of Session: 9:30am  Karen Yates [place] MRN: [id number]  Principal Diagnosis: Bipolar II disorder (HCC)  Secondary Diagnoses: Principal Problem:   Bipolar II disorder (HCC) Active Problems:   Major depressive disorder, recurrent episode, severe, with psychosis (HCC)   Alcohol use disorder, severe, dependence (HCC)   Current Medications:  Current Facility-Administered Medications  Medication Dose Route Frequency Provider Last Rate Last Dose  . acetaminophen (TYLENOL) tablet 650 mg  650 mg Oral Q4H PRN Charm RingsJamison Y Lord, NP   650 mg at 05/13/16 0756  . albuterol (PROVENTIL HFA;VENTOLIN HFA) 108 (90 Base) MCG/ACT inhaler 2 puff  2 puff Inhalation Q6H PRN Charm RingsJamison Y Lord, NP      . alum & mag hydroxide-simeth (MAALOX/MYLANTA) 200-200-20 MG/5ML suspension 30 mL  30 mL Oral Q4H PRN Charm RingsJamison Y Lord, NP      . divalproex (DEPAKOTE ER) 24 hr tablet 750 mg  750 mg Oral QHS Neysa Hottereina Hisada, MD   750 mg at 05/14/16 2214  . DULoxetine (CYMBALTA) DR capsule 90 mg  90 mg Oral Daily Neysa Hottereina Hisada, MD   90 mg at 05/15/16 [id number]  . hydrOXYzine (ATARAX/VISTARIL) tablet 25 mg  25 mg Oral Q6H PRN Neysa Hottereina Hisada, MD      . ibuprofen (ADVIL,MOTRIN) tablet 600 mg  600 mg Oral Q6H PRN Kristeen MansFran E Hobson, NP   600 mg at 05/13/16 1201  . levothyroxine (SYNTHROID, LEVOTHROID) tablet 88 mcg  88 mcg Oral QAC breakfast Charm RingsJamison Y Lord, NP   88 mcg at 05/15/16 [id number]  . magnesium hydroxide (MILK OF MAGNESIA) suspension 30 mL  30 mL Oral Daily PRN Charm RingsJamison Y Lord, NP      . multivitamin with minerals tablet 1 tablet  1 tablet Oral Daily Neysa Hottereina Hisada, MD   1 tablet at 05/15/16 0806  . [START ON 05/16/2016] risperiDONE (RISPERDAL) tablet 0.5 mg  0.5 mg Oral Daily Neysa Hottereina Hisada, MD      . thiamine (VITAMIN Yates-1) tablet 100 mg  100 mg Oral Daily Neysa Hottereina Hisada, MD   100 mg at 05/15/16 0806  . traZODone (DESYREL) tablet 50 mg  50 mg Oral QHS Neysa Hottereina Hisada, MD   50 mg at 05/14/16 2214     PTA Medications: Prescriptions Prior to Admission  Medication Sig Dispense Refill Last Dose  . albuterol (PROVENTIL HFA;VENTOLIN HFA) 108 (90 BASE) MCG/ACT inhaler Inhale 2 puffs into the lungs every 6 (six) hours as needed for wheezing or shortness of breath.   Past Month at Unknown time  . busPIRone (BUSPAR) 10 MG tablet Take 30 mg by mouth at bedtime.   05/07/2016 at Unknown time  . diphenhydrAMINE (BENADRYL) 25 mg capsule Take 25 mg by mouth every 6 (six) hours as needed for sleep.   05/07/2016 at Unknown time  . DULoxetine (CYMBALTA) 60 MG capsule Take 60 mg by mouth daily.   05/07/2016 at Unknown time  . levothyroxine (SYNTHROID, LEVOTHROID) 88 MCG tablet Take 88 mcg by mouth daily before breakfast.   05/08/2016 at Unknown time  . topiramate (TOPAMAX) 50 MG tablet Take 50 mg by mouth daily.   05/07/2016 at 2100  . ibuprofen (ADVIL,MOTRIN) 600 MG tablet Take 1 tablet (600 mg total) by mouth every 6 (six) hours. (Patient not taking: Reported on 05/08/2016) 30 tablet 1 Not Taking at Unknown time    Treatment Modalities: Medication Management, Group therapy, Case management,  1 to 1 session with clinician, Psychoeducation, Recreational therapy.   Physician Treatment Plan  for Primary Diagnosis: Bipolar II disorder (HCC) Long Term Goal(s): Improvement in symptoms so as ready for discharge  Short Term Goals: Ability to disclose and discuss suicidal ideas, Ability to identify and develop effective coping behaviors will improve and Ability to identify triggers associated with substance abuse/mental health issues will improve  Medication Management: Evaluate patient's response, side effects, and tolerance of medication regimen.  Therapeutic Interventions: 1 to 1 sessions, Unit Group sessions and Medication administration.  Evaluation of Outcomes: Progressing  Physician Treatment Plan for Secondary Diagnosis: Principal Problem:   Bipolar II disorder (HCC) Active Problems:   Major depressive  disorder, recurrent episode, severe, with psychosis (HCC)   Alcohol use disorder, severe, dependence (HCC)   Long Term Goal(s): Improvement in symptoms so as ready for discharge  Short Term Goals: Ability to verbalize feelings will improve, Ability to disclose and discuss suicidal ideas and Ability to demonstrate self-control will improve  Medication Management: Evaluate patient's response, side effects, and tolerance of medication regimen.  Therapeutic Interventions: 1 to 1 sessions, Unit Group sessions and Medication administration.  Evaluation of Outcomes: Progressing   RN Treatment Plan for Primary Diagnosis: Bipolar II disorder (HCC) Long Term Goal(s): Knowledge of disease and therapeutic regimen to maintain health will improve  Short Term Goals: Ability to demonstrate self-control, Ability to disclose and discuss suicidal ideas and Ability to identify and develop effective coping behaviors will improve  Medication Management: RN will administer medications as ordered by provider, will assess and evaluate patient's response and provide education to patient for prescribed medication. RN will report any adverse and/or side effects to prescribing provider.  Therapeutic Interventions: 1 on 1 counseling sessions, Psychoeducation, Medication administration, Evaluate responses to treatment, Monitor vital signs and CBGs as ordered, Perform/monitor CIWA, COWS, AIMS and Fall Risk screenings as ordered, Perform wound care treatments as ordered.  Evaluation of Outcomes: Progressing   LCSW Treatment Plan for Primary Diagnosis: Bipolar II disorder (HCC) Long Term Goal(s): Safe transition to appropriate next level of care at discharge, Engage patient in therapeutic group addressing interpersonal concerns.  Short Term Goals: Engage patient in aftercare planning with referrals and resources, Identify triggers associated with mental health/substance abuse issues and Increase skills for wellness and  recovery  Therapeutic Interventions: Assess for all discharge needs, 1 to 1 time with Social worker, Explore available resources and support systems, Assess for adequacy in community support network, Educate family and significant other(s) on suicide prevention, Complete Psychosocial Assessment, Interpersonal group therapy.  Evaluation of Outcomes: Progressing   Progress in Treatment: Attending groups: Yes Participating in groups: Yes Taking medication as prescribed: Yes, MD continues to assess for medication changes as needed Toleration medication: Yes, no side effects reported at this time Family/Significant other contact made: No, CSW assessing for appropriate contact Patient understands diagnosis: Yes AEB seeking help with substance abuse and depression Discussing patient identified problems/goals with staff: Yes Medical problems stabilized or resolved: Yes Denies suicidal/homicidal ideation: Yes Issues/concerns per patient self-inventory: None Other: N/A  New problem(s) identified: None identified at this time.   New Short Term/Long Term Goal(s): None identified at this time.   Discharge Plan or Barriers: Pt will return home and follow-up with outpatient services.   Reason for Continuation of Hospitalization: Depression Hallucinations Medication stabilization Suicidal ideation Withdrawal symptoms  Estimated Length of Stay: 1-2 days  Attendees: Patient: 05/15/2016    Physician: Dr. Vanetta Shawl 05/15/2016    Nursing: Antoine Primas, Midge Aver, RN 05/15/2016    RN Care Manager: Onnie Boer, RN 05/15/2016  Social Worker:  Samuella Bruin, LCSW 05/15/2016    Recreational Therapist:  05/15/2016     05/15/2016    Other:  05/15/2016    Other: 05/15/2016      Scribe for Treatment Team: Samuella Bruin, LCSW Clinical Social Worker [facility] [phone]
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
## 2016-05-15 NOTE — Progress Notes (Signed)
D   Pt appears depressed and sad   She interacts well with others and is compliant with treatment   She complained about her medication being changed and said she needs her depakote and risperdal in the morning as well as night  Pt said the increased dose of vistaril was what made her sleepy right before she spoke with the doctor during the day A   Verbal support given    Encouraged pt to speak with her doctor regarding her medication changes     Medications administered and effectiveness monitored    Q 15 min checks R   Pt is safe and was receptive to verbal support and agreed to speak with her doctor about the medication changes

## 2016-05-15 NOTE — Progress Notes (Signed)
Recreation Therapy Notes  Date: 05/15/16 Time: 0930 Location: 300 [address] Dayroom  Group Topic: Stress Management  Goal Area(s) Addresses:  Patient will verbalize importance of using healthy stress management.  Patient will identify positive emotions associated with healthy stress management.   Behavioral Response: Engaged  Intervention: Stress Management   Activity :  Progressive Muscle Relaxation.  LRT introduced to technique of progressive muscle relaxation to patients.  LRT read a script to guide the patients through the exercise.  Patients were to follow along as LRT read the script to engage in the activity.  Education:  Stress Management, Discharge Planning.   Education Outcome: Acknowledges edcuation/In group clarification offered/Needs additional education  Clinical Observations/Feedback:  Pt attended group.    Caroll RancherMarjette Jamarques Pinedo, LRT/CTRS   Caroll RancherLindsay, Skarlette Lattner A 05/15/2016 2:47 PM

## 2016-05-16 MED ORDER — NALTREXONE HCL 50 MG PO TABS
50.0000 mg | ORAL_TABLET | Freq: Every day | ORAL | Status: DC
  Administered 2016-05-16 – 2016-05-17 (×2): 50 mg via ORAL
  Filled 2016-05-16 (×4): qty 1

## 2016-05-16 MED ORDER — RISPERIDONE 1 MG PO TABS
1.0000 mg | ORAL_TABLET | Freq: Every day | ORAL | Status: DC
  Administered 2016-05-17: 1 mg via ORAL
  Filled 2016-05-16 (×3): qty 1

## 2016-05-16 MED ORDER — RISPERIDONE 0.5 MG PO TABS
0.5000 mg | ORAL_TABLET | Freq: Once | ORAL | Status: AC
  Administered 2016-05-16: 0.5 mg via ORAL
  Filled 2016-05-16: qty 1
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
## 2016-05-16 NOTE — Progress Notes (Addendum)
D:  Patient's self inventory sheet, pateint has fair sleep, sleep medication is helpful.  Fair appetite, normal energy level.  Rated depression 1, hopeless 2, anxiety 9.  Denied withdrawals..  Denied SI.  Denied physical problems.  Physical pain, knee, worst piain #3, no pain medicaiton.  Goal is mentally prep for re-entering sociaty.  Plans to meditate and peaceful thoughts.  Still having auditory hallucinations and anxious about going home and being alone.  Does have discharge plans.  A:  Medications administered per MD orders.  Emotional support and encouragement given patient. R:  Denied SI and HI, contracts for safety.  Denied A/V hallucinations.  Safety maintained with 15 minute checks.

## 2016-05-16 NOTE — [facility] Group Notes (Signed)
Patient attend group her day was 7. She had a rough day feeling down going home soon. She was happy to see her daughter.

## 2016-05-16 NOTE — Plan of Care (Signed)
Problem: Education: Goal: Utilization of techniques to improve thought processes will improve Outcome: Progressing Nurse discussed depression/coping skills with patient.    

## 2016-05-16 NOTE — [facility] Group Notes (Signed)
[facility] LCSW Group Therapy 05/16/2016  1:15 pm   Type of Therapy: Group Therapy Participation Level: Active  Participation Quality: Attentive, Sharing and Supportive  Affect: Lethargic  Cognitive: Alert and Oriented  Insight: Developing/Improving and Engaged  Engagement in Therapy: Developing/Improving and Engaged  Modes of Intervention: Clarification, Confrontation, Discussion, Education, Exploration, Limit-setting, Orientation, Problem-solving, Rapport Building, Dance movement psychotherapisteality Testing, Socialization and Support  Summary of Progress/Problems: The topic for group was balance in life. Today's group focused on defining balance in one's own words, identifying things that can knock one off balance, and exploring healthy ways to maintain balance in life. Group members were asked to provide an example of a time when they felt off balance, describe how they handled that situation,and process healthier ways to regain balance in the future. Group members were asked to share the most important tool for maintaining balance that they learned while at [facility] and how they plan to apply this method after discharge. Patient was lethargic in group and had difficulty keeping her eyes open. She participated minimally in discussion but stated that it is important to quit drinking alcohol for her recovery and that she would like to start going outside more/isolating less. She states that she is able to walk a lap around her neighborhood.    Karen BruinKristin Oneill Bais, MSW, LCSW Clinical Social Worker [facility] [phone]

## 2016-05-16 NOTE — [facility] Group Notes (Signed)

## 2016-05-16 NOTE — Progress Notes (Signed)
D   Pt appears depressed and sad   She interacts well with others and is compliant with treatment   She talked about her medication being changed back and said she thinks it will work better for her  A   Verbal support given      Medications administered and effectiveness monitored    Q 15 min checks R   Pt is safe and was receptive to verbal support and agreed to speak with her doctor about the medication changes

## 2016-05-17 DIAGNOSIS — F3181 Bipolar II disorder: Principal | ICD-10-CM

## 2016-05-17 MED ORDER — DULOXETINE HCL 30 MG PO CPEP: 90.0000 mg | ORAL_CAPSULE | Freq: Every day | ORAL | 0 refills | Status: DC

## 2016-05-17 MED ORDER — NALTREXONE HCL 50 MG PO TABS: 50.0000 mg | ORAL_TABLET | Freq: Every day | ORAL | 0 refills | Status: DC

## 2016-05-17 MED ORDER — TRAZODONE HCL 50 MG PO TABS: 50.0000 mg | ORAL_TABLET | Freq: Every day | ORAL | 0 refills | Status: DC

## 2016-05-17 MED ORDER — LEVOTHYROXINE SODIUM 88 MCG PO TABS: 88.0000 ug | ORAL_TABLET | Freq: Every day | ORAL | Status: DC

## 2016-05-17 MED ORDER — HYDROXYZINE HCL 25 MG PO TABS: 25.0000 mg | ORAL_TABLET | Freq: Four times a day (QID) | ORAL | 0 refills | Status: DC | PRN

## 2016-05-17 MED ORDER — IBUPROFEN 600 MG PO TABS: 600.0000 mg | ORAL_TABLET | Freq: Four times a day (QID) | ORAL | 0 refills | Status: DC

## 2016-05-17 MED ORDER — RISPERIDONE 1 MG PO TABS: 1.0000 mg | ORAL_TABLET | Freq: Every day | ORAL | 0 refills | Status: DC

## 2016-05-17 MED ORDER — ALBUTEROL SULFATE HFA 108 (90 BASE) MCG/ACT IN AERS: 2.0000 | INHALATION_SPRAY | Freq: Four times a day (QID) | RESPIRATORY_TRACT | Status: DC | PRN

## 2016-05-17 MED ORDER — DIVALPROEX SODIUM ER 250 MG PO TB24: 750.0000 mg | ORAL_TABLET | Freq: Every day | ORAL | 0 refills | Status: DC
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
## 2016-05-17 NOTE — Progress Notes (Signed)
Pt d/c from the hospital. All items returned. D/C instructions given and prescriptions given. Pt denies si and hi. 

## 2016-05-17 NOTE — BHH Suicide Risk Assessment (Signed)
[facility]   Principal Problem: Bipolar II disorder [facility](HCC) Discharge Diagnoses:  Patient Active Problem List   Diagnosis Date Noted  . Bipolar II disorder ([facility]) [F31.81] 05/10/2016  . Alcohol use disorder, severe, dependence ([facility]) [F10.20] 05/10/2016  . Major depressive disorder, recurrent severe without psychotic features ([facility]) [F33.2] 05/09/2016  . Alcohol abuse [F10.10] 05/09/2016  . Major depressive disorder, recurrent episode, severe, with psychosis ([facility]) [F33.3] 05/09/2016  . S/P cesarean section [Z98.891] 07/05/2015    Total Time spent with patient: 20 minutes  Musculoskeletal: Strength & Muscle Tone: within normal limits Gait & Station: normal Patient leans: N/A  Psychiatric Specialty Exam: Review of Systems  Psychiatric/Behavioral: Positive for depression and hallucinations. Negative for substance abuse and suicidal ideas. The patient is nervous/anxious and has insomnia.   All other systems reviewed and are negative.   Blood pressure 96/75, pulse (!) 110, temperature 98 F (36.7 C), temperature source Oral, resp. rate 18, height 5\' 6"  (1.676 m), weight 231 lb 8 oz (105 kg), last menstrual period 04/24/2016, SpO2 100 %, unknown if currently breastfeeding.Body mass index is 37.37 kg/m.  General Appearance: Fairly Groomed  Patent attorneyye Contact::  Good  Speech:  Clear and Coherent  Volume:  Normal  Mood:  "excited"  Affect:  Restricted- improving, smiles at times  Thought Process:  Coherent and Goal Directed  Orientation:  Full (Time, Place, and Person)  Thought Content:  Logical Perceptions: denies AH, reports VH of some shadow- improving  Suicidal Thoughts:  No  Homicidal Thoughts:  No  Memory:  intact to recent and remote recall  Judgement:  Fair  Insight:  Fair  Psychomotor Activity:  Normal  Concentration:  Good  Recall:  Good  Fund of Knowledge:Good  Language: Good  Akathisia:  No  Handed:  Right  AIMS (if indicated):     Assets:   Communication Skills Desire for Improvement  Sleep:  Number of Hours: 6.75  Cognition: WNL  ADL's:  Intact   Mental Status Per Nursing Assessment::   On Admission:  Suicidal ideation indicated by patient, Self-harm thoughts, Self-harm behaviors  Demographic Factors:  Caucasian and Unemployed  Loss Factors: NA  Historical Factors: Prior suicide attempts, Family history of mental illness or substance abuse and Impulsivity One suicidal attempt in the past; cutting her wrist and all over her body at age 30. SIB of cutting at age 30.  Paternal aunt-bipolar disorder, Parents, grandparents: depression, father- alcohol use  Risk Reduction Factors:   Sense of responsibility to family, Positive social support and Positive therapeutic relationship  Continued Clinical Symptoms:  Severe Anxiety and/or Agitation Panic Attacks Bipolar Disorder:   Bipolar II Alcohol/Substance Abuse/Dependencies  Cognitive Features That Contribute To Risk:  None    Suicide Risk:  Mild:  Suicidal ideation of limited frequency, intensity, duration, and specificity.  There are no identifiable plans, no associated intent, mild dysphoria and related symptoms, good self-control (both objective and subjective assessment), few other risk factors, and identifiable protective factors, including available and accessible social support.  Follow-up Information    [facility] Follow up on 05/28/2016.   Why:  Medication management appt with Everrett Coombeody Matthews on Tuesday Sept. 12th at 10:15am. Please call if you need to reschedule.  Contact information: [address],  [place], [place] [address]  Phone: [phone]        [facility] Follow up on 06/11/2016.   Specialty:  Behavioral Health Why:  Therapy appt with Dominic PeaSarah Solomon  on Tuesday Sept. 26th at 10am. Please arrive at 9:30am and bring your insurance card and new patient paperwork that will be mailed to your home  prior to your appt. Contact information: [address] [place] [address] [phone]         Patient reports lactation on her breast this morning. Patient is informed of side effect of risperidone which includes increased prolactin. She is advised to take half if her symptoms worsens. She had orthostasis this morning with dizziness; informed of orthostatic hypotension secondary to risperidone. She is encouraged for hydration. Again advised to take half of risperidone if her symptoms worsens. She will discuss it with her outpatient psychiatrist as well. She will be followed for her anemia.   She is future oriented and denies SI. She agrees with follow up plans as above.   Plan Of Care/Follow-up recommendations:  Activity:  no restriction Diet:  regular Tests:  n/a Other:  n/a  Neysa Hotter, MD 05/17/2016, 10:23 AM

## 2016-05-17 NOTE — Progress Notes (Signed)
D: Pt denies SI/HI and contracts for safety. Pt. States she has some A/V hallucinations. Patient states she hears something like a radio broadcast. She also states she see her pillow breathing. Pt is pleasant and cooperative. Pt goal for today is talk with her doctor. A: Pt was offered support and encouragement. Pt was given scheduled medications. Pt was encourage to attend groups. Q 15 minute checks were done for safety.  R:Pt attends groups and interacts well with peers and staff. Pt is taking medication. Pt has no complaints.Pt receptive to treatment and safety maintained on unit.

## 2016-05-17 NOTE — Progress Notes (Signed)
Recreation Therapy Notes  Date: 05/17/16 Time: 0930 Location: [address] Dayroom  Group Topic: Stress Management  Goal Area(s) Addresses:  Patient will verbalize importance of using healthy stress management.  Patient will identify positive emotions associated with healthy stress management.   Behavioral Response: Engaged  Intervention: Stress Management  Activity :  Guided Training and development officermagery Script.  LRT introduced the stress management technique guided imagery.  LRT read script that guided patients in how to perform the technique.  Patients were to follow along as LRT read the script to engage in the technique.  Education: Stress Management, Discharge Planning.   Education Outcome: Acknowledges edcuation/In group clarification offered/Needs additional education  Clinical Observations/Feedback: Pt attended group.   Caroll RancherMarjette Ketan Renz, LRT/CTRS        Caroll RancherLindsay, Setareh Rom A 05/17/2016 1:03 PM

## 2016-05-17 NOTE — Progress Notes (Signed)
Pt reported feeling dizzy and nauseated. Checked vitals sitting and standing. Encouraged fluids and gave [place]. MD aware and medications discussed with pt. Safety maintained on the unit.

## 2016-05-17 NOTE — Progress Notes (Signed)
  [facility] :  Will you be returning to the same living situation after discharge:  Yes,  patient plans to return home At discharge, do you have transportation home?: Yes,  family Do you have the ability to pay for your medications: Yes,  patient will be provided with prescriptions at discharge  Release of information consent forms completed and in the chart;  Patient's signature needed at discharge.  Patient to Follow up at: Follow-up Information    [facility] Follow up on 05/28/2016.   Why:  Medication management appt with Everrett Coombeody Matthews on Tuesday Sept. 12th at 10:15am. Please call if you need to reschedule.  Contact information: [address],  [place], [place] [address]  Phone: [phone]        [facility] Follow up on 06/11/2016.   Specialty:  Behavioral Health Why:  Therapy appt with Dominic PeaSarah Solomon on Tuesday Sept. 26th at 10am. Please arrive at 9:30am and bring your insurance card and new patient paperwork that will be mailed to your home prior to your appt. Contact information: [address] [place] [phone]          Next level of care provider has access to [facility] Link:no  Safety Planning and Suicide Prevention discussed: Yes,  with patient and husband  Have you used any form of tobacco in the last 30 days? (Cigarettes, Smokeless Tobacco, Cigars, and/or Pipes): No  Has patient been referred to the Quitline?: N/A patient is not a smoker  Patient has been referred for addiction treatment: Yes  Rodrigo Mcgranahan L Esau Fridman 05/17/2016, 11:44 AM2

## 2016-05-17 NOTE — Tx Team (Signed)
Interdisciplinary Treatment and Diagnostic Plan Update  05/17/2016 Time of Session: 9:30am  Karen Yates MRN: [id number]  Principal Diagnosis: Bipolar II disorder (HCC)  Secondary Diagnoses: Principal Problem:   Bipolar II disorder (HCC) Active Problems:   Major depressive disorder, recurrent episode, severe, with psychosis (HCC)   Alcohol use disorder, severe, dependence (HCC)   Current Medications:  Current Facility-Administered Medications  Medication Dose Route Frequency Provider Last Rate Last Dose  . acetaminophen (TYLENOL) tablet 650 mg  650 mg Oral Q4H PRN Charm Rings, NP   650 mg at 05/15/16 1417  . albuterol (PROVENTIL HFA;VENTOLIN HFA) 108 (90 Base) MCG/ACT inhaler 2 puff  2 puff Inhalation Q6H PRN Charm Rings, NP   2 puff at 05/15/16 2003  . alum & mag hydroxide-simeth (MAALOX/MYLANTA) 200-200-20 MG/5ML suspension 30 mL  30 mL Oral Q4H PRN Charm Rings, NP   30 mL at 05/16/16 0859  . divalproex (DEPAKOTE ER) 24 hr tablet 750 mg  750 mg Oral QHS Neysa Hotter, MD   750 mg at 05/16/16 2113  . DULoxetine (CYMBALTA) DR capsule 90 mg  90 mg Oral Daily Neysa Hotter, MD   90 mg at 05/16/16 0750  . hydrOXYzine (ATARAX/VISTARIL) tablet 25 mg  25 mg Oral Q6H PRN Neysa Hotter, MD   25 mg at 05/17/16 4098  . ibuprofen (ADVIL,MOTRIN) tablet 600 mg  600 mg Oral Q6H PRN Kristeen Mans, NP   600 mg at 05/13/16 1201  . levothyroxine (SYNTHROID, LEVOTHROID) tablet 88 mcg  88 mcg Oral QAC breakfast Charm Rings, NP   88 mcg at 05/17/16 1191  . magnesium hydroxide (MILK OF MAGNESIA) suspension 30 mL  30 mL Oral Daily PRN Charm Rings, NP      . multivitamin with minerals tablet 1 tablet  1 tablet Oral Daily Neysa Hotter, MD   1 tablet at 05/16/16 0750  . naltrexone (DEPADE) tablet 50 mg  50 mg Oral Daily Neysa Hotter, MD   50 mg at 05/16/16 1303  . risperiDONE (RISPERDAL) tablet 1 mg  1 mg Oral Daily Neysa Hotter, MD      . thiamine (VITAMIN B-1) tablet 100 mg  100 mg Oral Daily  Neysa Hotter, MD   100 mg at 05/16/16 0750  . traZODone (DESYREL) tablet 50 mg  50 mg Oral QHS Neysa Hotter, MD   50 mg at 05/16/16 2114    PTA Medications: Prescriptions Prior to Admission  Medication Sig Dispense Refill Last Dose  . albuterol (PROVENTIL HFA;VENTOLIN HFA) 108 (90 BASE) MCG/ACT inhaler Inhale 2 puffs into the lungs every 6 (six) hours as needed for wheezing or shortness of breath.   Past Month at Unknown time  . busPIRone (BUSPAR) 10 MG tablet Take 30 mg by mouth at bedtime.   05/07/2016 at Unknown time  . diphenhydrAMINE (BENADRYL) 25 mg capsule Take 25 mg by mouth every 6 (six) hours as needed for sleep.   05/07/2016 at Unknown time  . DULoxetine (CYMBALTA) 60 MG capsule Take 60 mg by mouth daily.   05/07/2016 at Unknown time  . levothyroxine (SYNTHROID, LEVOTHROID) 88 MCG tablet Take 88 mcg by mouth daily before breakfast.   05/08/2016 at Unknown time  . topiramate (TOPAMAX) 50 MG tablet Take 50 mg by mouth daily.   05/07/2016 at 2100  . ibuprofen (ADVIL,MOTRIN) 600 MG tablet Take 1 tablet (600 mg total) by mouth every 6 (six) hours. (Patient not taking: Reported on 05/08/2016) 30 tablet 1 Not Taking at  Unknown time    Treatment Modalities: Medication Management, Group therapy, Case management,  1 to 1 session with clinician, Psychoeducation, Recreational therapy.   Physician Treatment Plan for Primary Diagnosis: Bipolar II disorder (HCC) Long Term Goal(s): Improvement in symptoms so as ready for discharge  Short Term Goals: Ability to disclose and discuss suicidal ideas, Ability to identify and develop effective coping behaviors will improve and Ability to identify triggers associated with substance abuse/mental health issues will improve  Medication Management: Evaluate patient's response, side effects, and tolerance of medication regimen.  Therapeutic Interventions: 1 to 1 sessions, Unit Group sessions and Medication administration.  Evaluation of Outcomes: Adequate for  Discharge  Physician Treatment Plan for Secondary Diagnosis: Principal Problem:   Bipolar II disorder (HCC) Active Problems:   Major depressive disorder, recurrent episode, severe, with psychosis (HCC)   Alcohol use disorder, severe, dependence (HCC)   Long Term Goal(s): Improvement in symptoms so as ready for discharge  Short Term Goals: Ability to verbalize feelings will improve, Ability to disclose and discuss suicidal ideas and Ability to demonstrate self-control will improve  Medication Management: Evaluate patient's response, side effects, and tolerance of medication regimen.  Therapeutic Interventions: 1 to 1 sessions, Unit Group sessions and Medication administration.  Evaluation of Outcomes: Adequate for Discharge   RN Treatment Plan for Primary Diagnosis: Bipolar II disorder (HCC) Long Term Goal(s): Knowledge of disease and therapeutic regimen to maintain health will improve  Short Term Goals: Ability to demonstrate self-control, Ability to disclose and discuss suicidal ideas and Ability to identify and develop effective coping behaviors will improve  Medication Management: RN will administer medications as ordered by provider, will assess and evaluate patient's response and provide education to patient for prescribed medication. RN will report any adverse and/or side effects to prescribing provider.  Therapeutic Interventions: 1 on 1 counseling sessions, Psychoeducation, Medication administration, Evaluate responses to treatment, Monitor vital signs and CBGs as ordered, Perform/monitor CIWA, COWS, AIMS and Fall Risk screenings as ordered, Perform wound care treatments as ordered.  Evaluation of Outcomes: Adequate for Discharge   LCSW Treatment Plan for Primary Diagnosis: Bipolar II disorder (HCC) Long Term Goal(s): Safe transition to appropriate next level of care at discharge, Engage patient in therapeutic group addressing interpersonal concerns.  Short Term Goals:  Engage patient in aftercare planning with referrals and resources, Identify triggers associated with mental health/substance abuse issues and Increase skills for wellness and recovery  Therapeutic Interventions: Assess for all discharge needs, 1 to 1 time with Social worker, Explore available resources and support systems, Assess for adequacy in community support network, Educate family and significant other(s) on suicide prevention, Complete Psychosocial Assessment, Interpersonal group therapy.  Evaluation of Outcomes: Adequate for Discharge   Progress in Treatment: Attending groups: Yes Participating in groups: Yes Taking medication as prescribed: Yes, MD continues to assess for medication changes as needed Toleration medication: Yes, no side effects reported at this time Family/Significant other contact made: Yes, CSW has spoken with husband Patient understands diagnosis: Yes AEB seeking help with substance abuse and depression Discussing patient identified problems/goals with staff: Yes Medical problems stabilized or resolved: Yes Denies suicidal/homicidal ideation: Yes Issues/concerns per patient self-inventory: None Other: N/A  New problem(s) identified: None identified at this time.   New Short Term/Long Term Goal(s): None identified at this time.   Discharge Plan or Barriers: Pt will return home and follow-up with outpatient services.   Reason for Continuation of Hospitalization: Depression Hallucinations Medication stabilization Suicidal ideation Withdrawal symptoms  Estimated Length  of Stay: Discharge anticipated for today 05/17/16  Attendees: Patient: 05/17/2016    Physician: Dr. Vanetta Shawl 05/17/2016    Nursing: Antoine Primas, Midge Aver, RN 05/17/2016    RN Care Manager: Onnie Boer, RN 05/17/2016    Social Worker:  Belenda Cruise Urbano Milhouse, LCSW 05/17/2016    Recreational Therapist:  05/17/2016     05/17/2016    Other:  05/17/2016    Other: 05/17/2016      Scribe for Treatment  Team: Samuella Bruin, LCSW Clinical Social Worker [facility] [phone]

## 2016-05-17 NOTE — [facility] Suicide Risk Assessment (Signed)
[facility] INPATIENT:  Family/Significant Other Suicide Prevention Education  Suicide Prevention Education:  Education Completed; husband Nicolette Bangicholas Elliot [phone],  (name of family member/significant other) has been identified by the patient as the family member/significant other with whom the patient will be residing, and identified as the person(s) who will aid the patient in the event of a mental health crisis (suicidal ideations/suicide attempt).  With written consent from the patient, the family member/significant other has been provided the following suicide prevention education, prior to the and/or following the discharge of the patient.  The suicide prevention education provided includes the following:  Suicide risk factors  Suicide prevention and interventions  National Suicide Hotline telephone number  [facility] telephone number  [facility] 911  [facility] and/or [facility] telephone number  Request made of family/significant other to:  Remove weapons (e.g., guns, rifles, knives), all items previously/currently identified as safety concern.    Remove drugs/medications (over-the-counter, prescriptions, illicit drugs), all items previously/currently identified as a safety concern.  The family member/significant other verbalizes understanding of the suicide prevention education information provided.  The family member/significant other agrees to remove the items of safety concern listed above.  Lizbeth Feijoo L Ahnyla Mendel 05/17/2016, 11:43 AM

## 2016-06-01 ENCOUNTER — Encounter (HOSPITAL_BASED_OUTPATIENT_CLINIC_OR_DEPARTMENT_OTHER): Payer: Self-pay | Admitting: *Deleted

## 2016-06-01 ENCOUNTER — Emergency Department (HOSPITAL_BASED_OUTPATIENT_CLINIC_OR_DEPARTMENT_OTHER)
Admission: EM | Admit: 2016-06-01 | Discharge: 2016-06-02 | Disposition: A | Discharge status: Other Acute Inpatient Hospital | Payer: BLUE CROSS/BLUE SHIELD | Attending: Psychiatry | Admitting: Psychiatry

## 2016-06-01 DIAGNOSIS — J45909 Unspecified asthma, uncomplicated: Secondary | ICD-10-CM | POA: Diagnosis not present

## 2016-06-01 DIAGNOSIS — F314 Bipolar disorder, current episode depressed, severe, without psychotic features: Secondary | ICD-10-CM | POA: Diagnosis not present

## 2016-06-01 DIAGNOSIS — Z87891 Personal history of nicotine dependence: Secondary | ICD-10-CM | POA: Insufficient documentation

## 2016-06-01 DIAGNOSIS — T50901A Poisoning by unspecified drugs, medicaments and biological substances, accidental (unintentional), initial encounter: Secondary | ICD-10-CM | POA: Insufficient documentation

## 2016-06-01 DIAGNOSIS — E039 Hypothyroidism, unspecified: Secondary | ICD-10-CM | POA: Diagnosis not present

## 2016-06-01 DIAGNOSIS — F319 Bipolar disorder, unspecified: Secondary | ICD-10-CM | POA: Diagnosis not present

## 2016-06-01 DIAGNOSIS — R45851 Suicidal ideations: Secondary | ICD-10-CM

## 2016-06-01 DIAGNOSIS — Z792 Long term (current) use of antibiotics: Secondary | ICD-10-CM | POA: Diagnosis not present

## 2016-06-01 NOTE — ED Triage Notes (Signed)
Pt reports that she accidentally took 180mg  Cymbalta instead of rx dose of 90mg .  States nausea, HTN,  tremors and fatigue all day.  A/O, ambulatory.

## 2016-06-02 ENCOUNTER — Inpatient Hospital Stay (HOSPITAL_COMMUNITY)
Admission: AD | Admit: 2016-06-02 | Discharge: 2016-06-11 | DRG: 885 | Disposition: A | Discharge status: Home / Self Care | Payer: PRIVATE HEALTH INSURANCE | Source: Intra-hospital | Attending: Psychiatry | Admitting: Psychiatry

## 2016-06-02 ENCOUNTER — Encounter (HOSPITAL_COMMUNITY): Payer: Self-pay | Admitting: *Deleted

## 2016-06-02 DIAGNOSIS — F315 Bipolar disorder, current episode depressed, severe, with psychotic features: Secondary | ICD-10-CM | POA: Diagnosis present

## 2016-06-02 DIAGNOSIS — Z87891 Personal history of nicotine dependence: Secondary | ICD-10-CM

## 2016-06-02 DIAGNOSIS — E039 Hypothyroidism, unspecified: Secondary | ICD-10-CM | POA: Diagnosis present

## 2016-06-02 DIAGNOSIS — G4700 Insomnia, unspecified: Secondary | ICD-10-CM | POA: Diagnosis present

## 2016-06-02 DIAGNOSIS — Z62810 Personal history of physical and sexual abuse in childhood: Secondary | ICD-10-CM | POA: Diagnosis present

## 2016-06-02 DIAGNOSIS — F4310 Post-traumatic stress disorder, unspecified: Secondary | ICD-10-CM | POA: Diagnosis present

## 2016-06-02 DIAGNOSIS — R45851 Suicidal ideations: Secondary | ICD-10-CM | POA: Diagnosis present

## 2016-06-02 DIAGNOSIS — F319 Bipolar disorder, unspecified: Secondary | ICD-10-CM | POA: Diagnosis not present

## 2016-06-02 DIAGNOSIS — Z915 Personal history of self-harm: Secondary | ICD-10-CM | POA: Diagnosis not present

## 2016-06-02 DIAGNOSIS — Z23 Encounter for immunization: Secondary | ICD-10-CM

## 2016-06-02 DIAGNOSIS — J45909 Unspecified asthma, uncomplicated: Secondary | ICD-10-CM | POA: Diagnosis present

## 2016-06-02 DIAGNOSIS — Z79899 Other long term (current) drug therapy: Secondary | ICD-10-CM | POA: Diagnosis not present

## 2016-06-02 DIAGNOSIS — F4001 Agoraphobia with panic disorder: Secondary | ICD-10-CM | POA: Diagnosis present

## 2016-06-02 LAB — CBC WITH DIFFERENTIAL/PLATELET
BASOS ABS: 0 10*3/uL (ref 0.0–0.1)
Basophils Relative: 0 %
EOS ABS: 0 10*3/uL (ref 0.0–0.7)
Eosinophils Relative: 0 %
HCT: 35.6 % — ABNORMAL LOW (ref 36.0–46.0)
HEMOGLOBIN: 11.7 g/dL — AB (ref 12.0–15.0)
Lymphocytes Relative: 27 %
Lymphs Abs: 2.5 10*3/uL (ref 0.7–4.0)
MCH: 29.4 pg (ref 26.0–34.0)
MCHC: 32.9 g/dL (ref 30.0–36.0)
MCV: 89.4 fL (ref 78.0–100.0)
MONOS PCT: 7 %
Monocytes Absolute: 0.7 10*3/uL (ref 0.1–1.0)
NEUTROS ABS: 6.1 10*3/uL (ref 1.7–7.7)
NEUTROS PCT: 66 %
Platelets: 355 10*3/uL (ref 150–400)
RBC: 3.98 MIL/uL (ref 3.87–5.11)
RDW: 13.7 % (ref 11.5–15.5)
WBC: 9.3 10*3/uL (ref 4.0–10.5)

## 2016-06-02 LAB — SALICYLATE LEVEL

## 2016-06-02 LAB — BASIC METABOLIC PANEL
ANION GAP: 7 (ref 5–15)
BUN: 13 mg/dL (ref 6–20)
CHLORIDE: 106 mmol/L (ref 101–111)
CO2: 26 mmol/L (ref 22–32)
Calcium: 8.9 mg/dL (ref 8.9–10.3)
Creatinine, Ser: 0.72 mg/dL (ref 0.44–1.00)
Glucose, Bld: 135 mg/dL — ABNORMAL HIGH (ref 65–99)
POTASSIUM: 3.5 mmol/L (ref 3.5–5.1)
SODIUM: 139 mmol/L (ref 135–145)

## 2016-06-02 LAB — RAPID URINE DRUG SCREEN, HOSP PERFORMED
AMPHETAMINES: NOT DETECTED
BARBITURATES: NOT DETECTED
Benzodiazepines: NOT DETECTED
Cocaine: NOT DETECTED
OPIATES: NOT DETECTED
TETRAHYDROCANNABINOL: NOT DETECTED

## 2016-06-02 LAB — ETHANOL

## 2016-06-02 LAB — ACETAMINOPHEN LEVEL

## 2016-06-02 LAB — PREGNANCY, URINE: Preg Test, Ur: NEGATIVE

## 2016-06-02 MED ORDER — DIVALPROEX SODIUM 250 MG PO DR TAB
250.0000 mg | DELAYED_RELEASE_TABLET | Freq: Once | ORAL | Status: DC
  Filled 2016-06-02: qty 1

## 2016-06-02 MED ORDER — ARIPIPRAZOLE 5 MG PO TABS
5.0000 mg | ORAL_TABLET | Freq: Every day | ORAL | Status: DC
Start: 2016-06-02 — End: 2016-06-05
  Administered 2016-06-02 – 2016-06-05 (×4): 5 mg via ORAL
  Filled 2016-06-02 (×7): qty 1

## 2016-06-02 MED ORDER — LAMOTRIGINE 25 MG PO TABS
25.0000 mg | ORAL_TABLET | Freq: Every day | ORAL | Status: DC
  Administered 2016-06-02 – 2016-06-10 (×9): 25 mg via ORAL
  Filled 2016-06-02 (×12): qty 1

## 2016-06-02 MED ORDER — DIVALPROEX SODIUM ER 250 MG PO TB24
750.0000 mg | ORAL_TABLET | Freq: Every day | ORAL | Status: DC
  Administered 2016-06-02: 750 mg via ORAL
  Filled 2016-06-02: qty 3

## 2016-06-02 MED ORDER — NICOTINE POLACRILEX 2 MG MT GUM
2.0000 mg | CHEWING_GUM | OROMUCOSAL | Status: DC | PRN
  Administered 2016-06-02 (×3): 2 mg via ORAL
  Filled 2016-06-02 (×3): qty 1

## 2016-06-02 MED ORDER — DOXYCYCLINE HYCLATE 100 MG PO TABS
100.0000 mg | ORAL_TABLET | Freq: Once | ORAL | Status: AC
  Administered 2016-06-02: 100 mg via ORAL
  Filled 2016-06-02: qty 1

## 2016-06-02 MED ORDER — DOXYCYCLINE HYCLATE 100 MG PO TABS
100.0000 mg | ORAL_TABLET | Freq: Two times a day (BID) | ORAL | Status: DC
  Administered 2016-06-02 – 2016-06-11 (×19): 100 mg via ORAL
  Filled 2016-06-02 (×25): qty 1

## 2016-06-02 MED ORDER — HYDROXYZINE HCL 25 MG PO TABS
25.0000 mg | ORAL_TABLET | Freq: Four times a day (QID) | ORAL | Status: DC | PRN
  Administered 2016-06-02 – 2016-06-04 (×6): 25 mg via ORAL
  Filled 2016-06-02 (×6): qty 1

## 2016-06-02 MED ORDER — SACCHAROMYCES BOULARDII 250 MG PO CAPS
250.0000 mg | ORAL_CAPSULE | Freq: Two times a day (BID) | ORAL | Status: DC
  Administered 2016-06-03 – 2016-06-11 (×17): 250 mg via ORAL
  Filled 2016-06-02 (×23): qty 1

## 2016-06-02 MED ORDER — DIVALPROEX SODIUM 500 MG PO DR TAB
500.0000 mg | DELAYED_RELEASE_TABLET | Freq: Once | ORAL | Status: DC
  Filled 2016-06-02: qty 1

## 2016-06-02 MED ORDER — ACETAMINOPHEN 325 MG PO TABS
650.0000 mg | ORAL_TABLET | Freq: Four times a day (QID) | ORAL | Status: DC | PRN
  Administered 2016-06-02 – 2016-06-10 (×10): 650 mg via ORAL
  Filled 2016-06-02 (×11): qty 2

## 2016-06-02 MED ORDER — TRAZODONE HCL 50 MG PO TABS
50.0000 mg | ORAL_TABLET | Freq: Every day | ORAL | Status: DC
  Administered 2016-06-02: 50 mg via ORAL
  Filled 2016-06-02 (×4): qty 1

## 2016-06-02 MED ORDER — DULOXETINE HCL 30 MG PO CPEP
30.0000 mg | ORAL_CAPSULE | Freq: Every day | ORAL | Status: DC
  Administered 2016-06-03 – 2016-06-04 (×2): 30 mg via ORAL
  Filled 2016-06-02 (×4): qty 1

## 2016-06-02 MED ORDER — ACETAMINOPHEN 500 MG PO TABS
1000.0000 mg | ORAL_TABLET | Freq: Once | ORAL | Status: AC
  Administered 2016-06-02: 1000 mg via ORAL
  Filled 2016-06-02: qty 2

## 2016-06-02 MED ORDER — TRAZODONE HCL 50 MG PO TABS
50.0000 mg | ORAL_TABLET | Freq: Once | ORAL | Status: DC
  Filled 2016-06-02: qty 1

## 2016-06-02 NOTE — ED Notes (Signed)
BH/TTS counselor updated, "meets inpt criteria, pending bed placement".
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
## 2016-06-02 NOTE — ED Notes (Signed)
Spoke with poison control who advised that pt have an EKG.  States that overdose from Cymbalta generally has symptoms 2-5 hours after ingestion.
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
## 2016-06-02 NOTE — Progress Notes (Signed)
Adult Psychoeducational Group Note  Date:  06/02/2016 Time:  8:54 PM  Group Topic/Focus:  Wrap-Up Group:   The focus of this group is to help patients review their daily goal of treatment and discuss progress on daily workbooks.   Participation Level:  Active  Participation Quality:  Appropriate and Attentive  Affect:  Appropriate  Cognitive:  Appropriate  Insight: Appropriate and Good  Engagement in Group:  Engaged  Modes of Intervention:  Discussion  Additional Comments:  Pt just arrived to the unit today and did not set a goal, however she wants to work on herself so that she can rejoin society as a functioning person. Pt states one positive thing for today is that she got to speak to the dr and was honest and feels like she is on the right path.  Karen Yates, Karen Yates 06/02/2016, 8:54 PM
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
## 2016-06-02 NOTE — BH Assessment (Signed)
[facility] bed 2 by Rutha BouchardJoAnn, AC. Attending physician will be Dr.Eapen. Pt may arrive after 0800. Call report to [phone]. John, RN informed of pt disposition.

## 2016-06-02 NOTE — ED Notes (Signed)
Admits to Marijuana, but does not want husband to know, TTS initiated/ in progress.

## 2016-06-02 NOTE — ED Notes (Signed)
TTS complete, pt watching TV, given juice per request, [facility], NAD, calm, interactive, alert, in view of nurses station.
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
## 2016-06-02 NOTE — ED Notes (Signed)
Attempted report 

## 2016-06-02 NOTE — ED Notes (Signed)
[facility], NAD, calm, interactive, resps e/u, speaking in clear complete sentences, skin W&D. Initially here for taking an extra cymbalta. (denies continued sx from that and feels relatively fine physically), mentions HA, (denies: dizziness, nv, sob or other physical sx), "took dramamine PTA and nausea improved", VSS.  Reports: SI with plan, "feel like I need to be admitted", states, "I was fine for about a week after [facility] d/c, then over the last 2 weeks things have gradually progressively unraveled/ worsened", ETOH abuse was present with last admission, but denies ETOH since. C/o increasing paranoia, anxiety, depression, AVH (hearing radio signals and seeing things out of the corner of my eye), and SI with plan. Plan and process explained with rationale, pt agreeable. [facility]

## 2016-06-02 NOTE — ED Notes (Signed)
Sleeping prone, NAD, calm, resps e/u, rise and fall of chest noted, repositions self, in view of nurses station. No changes.

## 2016-06-02 NOTE — [facility] Group Notes (Signed)
[facility] LCSW Group Therapy  06/02/2016 11:00 AM   Type of Therapy:  Group Therapy  Participation Level:  Did Not Attend  Kevyn Boquet, LCSW 06/02/2016 1:04 PM  

## 2016-06-02 NOTE — ED Notes (Signed)
Sleeping prone, NAD, calm, resps e/u, rise and fall of chest noted, repositions self, in view of nurses station. No changes.  

## 2016-06-02 NOTE — Progress Notes (Signed)
D: Patient in dayroom upon initial approach. Patient complained of headache 5/10 and intestinal cramping that she reported "started after dinner". Patient reported one episode of diarrhea.  A: Introduced self to patient and offered support. Administered ordered prn med for headache. On call provider notified concerning diarrhea.   R: Patient compliant with medication. Pt. Reports total relief from headache pain and intestinal cramps when reassessed an hour later. Patient denies SI and AV hallucinations. Will continue to monitor and assess.

## 2016-06-02 NOTE — ED Notes (Signed)
No changes, up to b/r, sitter at side, alert, NAD, calm, interactive, steady gait.
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
## 2016-06-03 MED ORDER — IBUPROFEN 600 MG PO TABS
600.0000 mg | ORAL_TABLET | Freq: Four times a day (QID) | ORAL | Status: DC | PRN
  Administered 2016-06-03 – 2016-06-06 (×4): 600 mg via ORAL
  Filled 2016-06-03 (×4): qty 1

## 2016-06-03 MED ORDER — NICOTINE 14 MG/24HR TD PT24
14.0000 mg | MEDICATED_PATCH | Freq: Every day | TRANSDERMAL | Status: DC
  Administered 2016-06-03 – 2016-06-11 (×9): 14 mg via TRANSDERMAL
  Filled 2016-06-03 (×12): qty 1

## 2016-06-03 MED ORDER — PRAZOSIN HCL 1 MG PO CAPS
1.0000 mg | ORAL_CAPSULE | Freq: Every day | ORAL | Status: DC
  Administered 2016-06-03 – 2016-06-10 (×8): 1 mg via ORAL
  Filled 2016-06-03 (×11): qty 1

## 2016-06-03 MED ORDER — TRAZODONE HCL 50 MG PO TABS
50.0000 mg | ORAL_TABLET | Freq: Every evening | ORAL | Status: DC | PRN
  Administered 2016-06-03: 50 mg via ORAL

## 2016-06-03 MED ORDER — NICOTINE 21 MG/24HR TD PT24: 21.0000 mg | MEDICATED_PATCH | Freq: Every day | TRANSDERMAL | Status: DC

## 2016-06-03 NOTE — Progress Notes (Signed)
Adult Psychoeducational Group Note  Date:  06/03/2016 Time:  11:31 AM  Group Topic/Focus:  Goals Group:   The focus of this group is to help patients establish daily goals to achieve during treatment and discuss how the patient can incorporate goal setting into their daily lives to aide in recovery.   Participation Level:  Active  Participation Quality:  Appropriate  Affect:  Appropriate  Cognitive:  Alert and Appropriate  Insight: Appropriate, Good and Improving  Engagement in Group:  Engaged  Modes of Intervention:  Discussion  Additional Comments:  Pt participated in group this morning.  Pt states that she wants to have a more stable relationship with her mother and wants to work on coping skills so she can handle her anxiety and paranoia. Karen Yates R Sonnie Bias 06/03/2016, 11:31 AM

## 2016-06-03 NOTE — Progress Notes (Signed)
Recreation Therapy Notes  Date: 06/03/16 Time: 0930 Location: [address] Group Room  Group Topic: Stress Management  Goal Area(s) Addresses:  Patient will verbalize importance of using healthy stress management.  Patient will identify positive emotions associated with healthy stress management.   Intervention: Stress Management  Activity :  Progressive Muscle Relaxation.  LRT introduced the technique of progressive muscle relaxation.  LRT read a script so patients could participate in the activity.  Patients were to follow along as LRT read script.  Education:  Stress Management, Discharge Planning.   Education Outcome: Needs additional education  Clinical Observations/Feedback: Pt did not attend group.    [name], LRT/CTRS         [name] A 06/03/2016 1:03 PM 

## 2016-06-03 NOTE — Progress Notes (Signed)
D: Patient up and visible in the milieu. Spoke with patient 1:1. Rates sleep poor, appetite poor, energy low and concentration poor. Patient's affect flat, anxious and depressed with congruent mood. Rating her depression at a 8/10, hopelessness at a 8/10 and anxiety at a 9/10. States goal for today is to "participate in groups" and to find "something that deals with anxiety better than vistaril. States she was on a buspar for a year but did not feel it helped. Continues to have mild to moderate sinus pain/headache as well as complaints of anxiety. "I lost it when I met with the social worker but I know I need to get the bad stuff out."  A: Medicated per orders, tylenol and visrarl prn given. Emotional support offered and self inventory reviewed.   R: On reassess, patient reports no change in anxiety however pain reduced to a 2/10. Patient denies SI/HI and remains safe on level III obs.

## 2016-06-03 NOTE — Tx Team (Signed)
Interdisciplinary Treatment and Diagnostic Plan Update  06/03/2016 Time of Session: 8:58 AM  Karen Yates MRN: [id number]  Principal Diagnosis: Bipolar I disorder, most recent episode (or current) depressed, severe, specified as with psychotic behavior  Secondary Diagnoses: Active Problems:   Bipolar I disorder, most recent episode (or current) depressed, severe, specified as with psychotic behavior (HCC)   Current Medications:  Current Facility-Administered Medications  Medication Dose Route Frequency Provider Last Rate Last Dose  . acetaminophen (TYLENOL) tablet 650 mg  650 mg Oral Q6H PRN Fran E Hobson, NP   650 mg at 06/03/16 0639  . ARIPiprazole (ABILIFY) tablet 5 mg  5 mg Oral Daily Fernando A Cobos, MD   5 mg at 06/03/16 0747  . doxycycline (VIBRA-TABS) tablet 100 mg  100 mg Oral Q12H Fernando A Cobos, MD   100 mg at 06/03/16 0747  . DULoxetine (CYMBALTA) DR capsule 30 mg  30 mg Oral Daily Fernando A Cobos, MD   30 mg at 06/03/16 0747  . hydrOXYzine (ATARAX/VISTARIL) tablet 25 mg  25 mg Oral Q6H PRN Fernando A Cobos, MD   25 mg at 06/03/16 0628  . lamoTRIgine (LAMICTAL) tablet 25 mg  25 mg Oral Daily Fernando A Cobos, MD   25 mg at 06/03/16 0747  . nicotine polacrilex (NICORETTE) gum 2 mg  2 mg Oral PRN Fernando A Cobos, MD   2 mg at 06/02/16 2143  . saccharomyces boulardii (FLORASTOR) capsule 250 mg  250 mg Oral BID Fran E Hobson, NP   250 mg at 06/03/16 0747  . traZODone (DESYREL) tablet 50 mg  50 mg Oral QHS Fernando A Cobos, MD   50 mg at 06/02/16 2136    PTA Medications: Prescriptions Prior to Admission  Medication Sig Dispense Refill Last Dose  . divalproex (DEPAKOTE ER) 250 MG 24 hr tablet Take 3 tablets (750 mg total) by mouth at bedtime. For mood stabilization 90 tablet 0 Past Week at Unknown time  . doxycycline (DORYX) 100 MG EC tablet Take 100 mg by mouth 2 (two) times daily.   Past Week at Unknown time  . DULoxetine (CYMBALTA) 30 MG capsule Take 3 capsules (90 mg  total) by mouth daily. For depression 90 capsule 0 Past Week at Unknown time  . levothyroxine (SYNTHROID, LEVOTHROID) 88 MCG tablet Take 1 tablet (88 mcg total) by mouth daily before breakfast. For low functioning Thyroid gland   Past Week at Unknown time  . naltrexone (DEPADE) 50 MG tablet Take 1 tablet (50 mg total) by mouth daily. For alcohol cravings 30 tablet 0 Past Week at Unknown time  . risperiDONE (RISPERDAL) 1 MG tablet Take 1 tablet (1 mg total) by mouth daily. For mood control 30 tablet 0 Past Week at Unknown time  . traZODone (DESYREL) 50 MG tablet Take 1 tablet (50 mg total) by mouth at bedtime. For sleep 30 tablet 0 Past Week at Unknown time  . albuterol (PROVENTIL HFA;VENTOLIN HFA) 108 (90 Base) MCG/ACT inhaler Inhale 2 puffs into the lungs every 6 (six) hours as needed for wheezing or shortness of breath.   Unknown at Unknown time  . hydrOXYzine (ATARAX/VISTARIL) 25 MG tablet Take 1 tablet (25 mg total) by mouth every 6 (six) hours as needed for anxiety. 60 tablet 0 Unknown at Unknown time  . ibuprofen (ADVIL,MOTRIN) 600 MG tablet Take 1 tablet (600 mg total) by mouth every 6 (six) hours. For moderate pain 1 tablet 0 Unknown at Unknown time    Treatment Modalities: Medication   Management, Group therapy, Case management,  1 to 1 session with clinician, Psychoeducation, Recreational therapy.   Physician Treatment Plan for Primary Diagnosis: Bipolar I disorder, most recent episode (or current) depressed, severe, specified as with psychotic behavior Long Term Goal(s): Improvement in symptoms so as ready for discharge  Short Term Goals: Ability to identify and develop effective coping behaviors will improve and Ability to maintain clinical measurements within normal limits will improve  Medication Management: Evaluate patient's response, side effects, and tolerance of medication regimen.  Therapeutic Interventions: 1 to 1 sessions, Unit Group sessions and Medication  administration.  Evaluation of Outcomes: Not Met  Physician Treatment Plan for Secondary Diagnosis: Active Problems:   Bipolar I disorder, most recent episode (or current) depressed, severe, specified as with psychotic behavior (HCC)   Long Term Goal(s): Improvement in symptoms so as ready for discharge  Short Term Goals: Ability to identify and develop effective coping behaviors will improve and Ability to identify triggers associated with substance abuse/mental health issues will improve  Medication Management: Evaluate patient's response, side effects, and tolerance of medication regimen.  Therapeutic Interventions: 1 to 1 sessions, Unit Group sessions and Medication administration.  Evaluation of Outcomes: Not Met   RN Treatment Plan for Primary Diagnosis: Bipolar I disorder, most recent episode (or current) depressed, severe, specified as with psychotic behavior Long Term Goal(s): Knowledge of disease and therapeutic regimen to maintain health will improve  Short Term Goals: Ability to verbalize feelings will improve, Ability to disclose and discuss suicidal ideas and Ability to identify and develop effective coping behaviors will improve  Medication Management: RN will administer medications as ordered by provider, will assess and evaluate patient's response and provide education to patient for prescribed medication. RN will report any adverse and/or side effects to prescribing provider.  Therapeutic Interventions: 1 on 1 counseling sessions, Psychoeducation, Medication administration, Evaluate responses to treatment, Monitor vital signs and CBGs as ordered, Perform/monitor CIWA, COWS, AIMS and Fall Risk screenings as ordered, Perform wound care treatments as ordered.  Evaluation of Outcomes: Not Met   LCSW Treatment Plan for Primary Diagnosis: Bipolar I disorder, most recent episode (or current) depressed, severe, specified as with psychotic behavior Long Term Goal(s): Safe  transition to appropriate next level of care at discharge, Engage patient in therapeutic group addressing interpersonal concerns.  Short Term Goals: Engage patient in aftercare planning with referrals and resources, Increase emotional regulation, Identify triggers associated with mental health/substance abuse issues and Increase skills for wellness and recovery  Therapeutic Interventions: Assess for all discharge needs, 1 to 1 time with Social worker, Explore available resources and support systems, Assess for adequacy in community support network, Educate family and significant other(s) on suicide prevention, Complete Psychosocial Assessment, Interpersonal group therapy.  Evaluation of Outcomes: Not Met   Progress in Treatment: Attending groups: Pt is new to milieu, continuing to assess  Participating in groups: Pt is new to milieu, continuing to assess  Taking medication as prescribed: Yes, MD continues to assess for medication changes as needed Toleration medication: Yes, no side effects reported at this time Family/Significant other contact made: No, CSW assessing for appropriate contact Patient understands diagnosis: Continuing to assess Discussing patient identified problems/goals with staff: Yes Medical problems stabilized or resolved: Yes Denies suicidal/homicidal ideation: No, admitted with SI Issues/concerns per patient self-inventory: None Other: N/A  New problem(s) identified: None identified at this time.   New Short Term/Long Term Goal(s): None identified at this time.   Discharge Plan or Barriers: Pt will return   home and follow-up with outpatient services.   Reason for Continuation of Hospitalization: Anxiety Depression Hallucinations Medication stabilization Suicidal ideation  Estimated Length of Stay: 3-5 days  Attendees: Patient: 06/03/2016  8:58 AM  Physician: Dr. Parke Poisson 06/03/2016  8:58 AM  Nursing:  06/03/2016  8:58 AM  RN Care Manager: Lars Pinks, RN  06/03/2016  8:58 AM  Social Worker: Peri Maris, LCSW; Kristin Drinkard, LCSW 06/03/2016  8:58 AM  Recreational Therapist:  06/03/2016  8:58 AM  Other: Lindell Spar, NP; Samuel Jester, NP 06/03/2016  8:58 AM  Other:  06/03/2016  8:58 AM  Other: 06/03/2016  8:58 AM    Scribe for Treatment Team: Bo Mcclintock, LCSW 06/03/2016 8:58 AM

## 2016-06-03 NOTE — [facility] Counselor (Signed)
Adult Comprehensive Assessment  Patient ID: Karen Yates, female   DOB: 1986/02/23, 30 y.o.   MRN: [id number]  Information Source: Information source: Patient  Current Stressors:  Educational / Learning stressors: n/a Employment / Job issues: pt too anxious to hold a job Family Relationships: relationship with mom is very stressful, two sons just moved to [place] with ex husband with little Water engineer / Lack of resources (include bankruptcy): yes, pt sts she impulsively spends, [employer] / Lack of housing: n/a Physical health (include injuries & life threatening diseases): n/a Social relationships: pt reports no social relationships  Substance abuse: daily drinking Bereavement / Loss: loss of two kids to [place]  Living/Environment/Situation:  Living Arrangements: Spouse/significant other, Children (husband, 10 mo daughter) Living conditions (as described by patient or guardian): good How long has patient lived in current situation?: two yrs What is atmosphere in current home: Chaotic, Supportive  Family History:  Marital status: Married What types of issues is patient dealing with in the relationship?: pt's impulsive spending Additional relationship information: pt reports husband is supportive and a friend to her, pt's ex husband and pt's current husband are brothers Are you sexually active?: Yes Does patient have children?: Yes How many children?: 3 How is patient's relationship with their children?: 11 mo daughter lives w/ pt, 31 yo and 61 yo sons just moved to [place] with ex husband  Childhood History:     Education:  Highest grade of school patient has completed: some college Currently a Consulting civil engineer?: No Learning disability?: No  Employment/Work Situation:   Employment situation: Unemployed Patient's job has been impacted by current illness: Yes Describe how patient's job has been impacted: pt sts too anxious to walk into environment with a lot of people she  doesn't know 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 Has patient ever been in the [employer]?: No Has patient ever served in combat?: No Did You Receive Any Psychiatric Treatment/Services While in Equities trader?: No Are There Guns or Other Weapons in Your Home?: No  Financial Resources:   Financial resources: Income from spouse (food stamps cut off, just reapplied) Does patient have a representative payee or guardian?: No  Alcohol/Substance Abuse:   What has been your use of drugs/alcohol within the last 12 months?: drinks beer or wine daily If attempted suicide, did drugs/alcohol play a role in this?: No Alcohol/Substance Abuse Treatment Hx: Past Tx, Inpatient If yes, describe treatment: 2013 [facility] Has alcohol/substance abuse ever caused legal problems?: No  Social Support System:   Conservation officer, nature Support System: None Describe Community Support System: n/a Type of faith/religion: pt spiritual but doesn't attend organized religion. She feels most spiritual in woods. Would go to church but she is too anxious to walk into a church service How does patient's faith help to cope with current illness?: n/a  Leisure/Recreation:   Leisure and Hobbies: cooks  Strengths/Needs:   What things does the patient do well?: sings, cooks, being in the woods In what areas does patient struggle / problems for patient: math, talking on phone, new social situations  Discharge Plan:   Does patient have access to transportation?: Yes (pt sts a relative would pick her up if husband can't) Will patient be returning to same living situation after discharge?: Yes Currently receiving community mental health services: No If no, would patient like referral for services when discharged?: Yes (What county?) (would like referral for [employer] [facility] and if that  is not an option, DBT treatment) Does patient have financial barriers related to discharge medications?:  Yes Patient description of barriers related to discharge medications: pt sts has medicaid but sometimes can't afford the $3 copay  Summary/Recommendations:  Patient is a 30 year old female with a diagnosis of Major Depressive Disorder, PTSD, and Alcohol Use Disorder. Pt presented to the hospital with suicidal thoughts, paranoia, and auditory hallucinations. Pt reports primary trigger(s) for admission was worsening anxiety and inability to function at appropriate levels. Patient will benefit from crisis stabilization, medication evaluation, group therapy and psycho education in addition to case management for discharge planning. At discharge it is recommended that Pt remain compliant with established discharge plan and continued treatment.  Chad CordialLauren Carter, LCSW Clinical Social Work [phone]

## 2016-06-03 NOTE — [facility] Group Notes (Signed)
[facility] LCSW Group Therapy  06/03/2016 1:15pm  Type of Therapy:  Group Therapy vercoming Obstacles  Participation Level:  Reserved  Participation Quality:  Appropriate   Affect:  Appropriate  Cognitive:  Appropriate and Oriented  Insight:  Developing/Improving and Improving  Engagement in Therapy:  Improving  Modes of Intervention:  Discussion, Exploration, Problem-solving and Support  Description of Group:   In this group patients will be encouraged to explore what they see as obstacles to their own wellness and recovery. They will be guided to discuss their thoughts, feelings, and behaviors related to these obstacles. The group will process together ways to cope with barriers, with attention given to specific choices patients can make. Each patient will be challenged to identify changes they are motivated to make in order to overcome their obstacles. This group will be process-oriented, with patients participating in exploration of their own experiences as well as giving and receiving support and challenge from other group members.  Summary of Patient Progress: Pt participated minimally but was attentive throughout. Pt did offer affirming comments with peers who shared experiences that she could relate to. However, towards the end of group, Pt became lethargic.    Therapeutic Modalities:   Cognitive Behavioral Therapy Solution Focused Therapy Motivational Interviewing Relapse Prevention Therapy   Chad CordialLauren Carter, LCSWA 06/03/2016 4:59 PM
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
## 2016-06-03 NOTE — Plan of Care (Signed)
Problem: Safety: Goal: Periods of time without injury will increase Outcome: Progressing Patient has not engaged in self harm, denies SI.  Problem: Medication: Goal: Compliance with prescribed medication regimen will improve Outcome: Progressing Patient med compliant.

## 2016-06-04 LAB — TSH: TSH: 5.857 u[IU]/mL — AB (ref 0.350–4.500)

## 2016-06-04 MED ORDER — LEVOTHYROXINE SODIUM 75 MCG PO TABS
75.0000 ug | ORAL_TABLET | Freq: Every day | ORAL | Status: DC
  Administered 2016-06-05 – 2016-06-11 (×7): 75 ug via ORAL
  Filled 2016-06-04 (×10): qty 1

## 2016-06-04 MED ORDER — MIRTAZAPINE 7.5 MG PO TABS
7.5000 mg | ORAL_TABLET | Freq: Every day | ORAL | Status: DC
  Administered 2016-06-04 – 2016-06-05 (×2): 7.5 mg via ORAL
  Filled 2016-06-04 (×3): qty 1

## 2016-06-04 MED ORDER — LORAZEPAM 0.5 MG PO TABS
0.5000 mg | ORAL_TABLET | Freq: Four times a day (QID) | ORAL | Status: DC | PRN
  Administered 2016-06-04 – 2016-06-11 (×18): 0.5 mg via ORAL
  Filled 2016-06-04 (×20): qty 1

## 2016-06-04 MED ORDER — DULOXETINE HCL 20 MG PO CPEP
20.0000 mg | ORAL_CAPSULE | Freq: Every day | ORAL | Status: DC
  Administered 2016-06-05: 20 mg via ORAL
  Filled 2016-06-04 (×3): qty 1

## 2016-06-04 NOTE — Progress Notes (Signed)
D: Patient denies SI/HI and A/V hallucinations; patient reports sleep is poor; reports appetite is fair; reports energy level is normal ; reports ability to concentrate is poor; rates depression as 3/10; rates hopelessness 4/10; rates anxiety as 9/10;   A: Monitored q 15 minutes; patient encouraged to attend groups; patient educated about medications; patient given medications per physician orders; patient encouraged to express feelings and/or concerns  R: Patient is assertive but pleasant; patient is concerned about discharge treatment and needing long term treatment; patient's interaction with staff and peers is appropriate; patient was able to set goal to talk with staff 1:1 when having feelings of SI; patient is taking medications as prescribed and tolerating medications; patient is attending all groups

## 2016-06-04 NOTE — Progress Notes (Signed)
Pt has been visible in the milieu and in the dayroom.  She was seen talking with peers and watching TV.  She reports that she has passive suicidal thoughts, but can contract for safety.  She denies HI/AVH.  She had a visit from her husband tonight, which she said was a good visit.  She asked about her thyroid med and naltraxone which has not been restarted.  She also asked for something other than tylenol for her headache and sinus pain and stated that the vistaril was not helping her anxiety.  Provider on call was informed about pt's concerns.  Pt is to have TSH drawn in the morning.  She received an order for Ibuprofen 600 mg q6h for which she was grateful.  Provider referred her request to change the Vistaril to her doctor in the morning.  Pt was also told about the lab in the morning.  Pt voiced understanding.  Pt was also started on minipress tonight; med teaching was done.  Support and encouragement offered.  Discharge plans are in process.  Pt makes her needs known to staff.  Safety maintained with q15 minute checks.

## 2016-06-04 NOTE — [facility] Group Notes (Signed)
The focus of this group is to educate the patient on the purpose and policies of crisis stabilization and provide a format to answer questions about their admission.  The group details unit policies and expectations of patients while admitted. Patient attended the 0900 morning group. Patient was engaging and appropriate. 
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
## 2016-06-04 NOTE — [facility] Group Notes (Signed)
[facility] LCSW Group Therapy 06/04/2016 1:15 PM  Type of Therapy: Group Therapy- Feelings about Diagnosis  Participation Level: Active   Participation Quality:  Appropriate  Affect:  Appropriate  Cognitive: Alert and Oriented   Insight:  Developing   Engagement in Therapy: Developing/Improving and Engaged   Modes of Intervention: Clarification, Confrontation, Discussion, Education, Exploration, Limit-setting, Orientation, Problem-solving, Rapport Building, Dance movement psychotherapisteality Testing, Socialization and Support  Description of Group:   This group will allow patients to explore their thoughts and feelings about diagnoses they have received. Patients will be guided to explore their level of understanding and acceptance of these diagnoses. Facilitator will encourage patients to process their thoughts and feelings about the reactions of others to their diagnosis, and will guide patients in identifying ways to discuss their diagnosis with significant others in their lives. This group will be process-oriented, with patients participating in exploration of their own experiences as well as giving and receiving support and challenge from other group members.  Summary of Progress/Problems:  Pt expressed that she feels that her diagnosis doesn't have to be all negative and expresses that she is trying to cope with her level of functioning in the community.   Therapeutic Modalities:   Cognitive Behavioral Therapy Solution Focused Therapy Motivational Interviewing Relapse Prevention Therapy  Chad CordialLauren Carter, LCSWA 06/04/2016 4:31 PM

## 2016-06-04 NOTE — Progress Notes (Signed)
Adult Psychoeducational Group Note  Date:  06/04/2016 Time:  9:39 PM  Group Topic/Focus:  Wrap-Up Group:   The focus of this group is to help patients review their daily goal of treatment and discuss progress on daily workbooks.   Participation Level:  Active  Participation Quality:  Appropriate  Affect:  Appropriate  Cognitive:  Appropriate  Insight: Good  Engagement in Group:  Engaged  Modes of Intervention:  Discussion  Additional Comments:  Patient goal was to talk [place] about Meds and have halfway accomplished her goal. Karen CarlsKELLY, [name] 06/04/2016, 9:39 PM

## 2016-06-05 MED ORDER — ARIPIPRAZOLE 10 MG PO TABS
10.0000 mg | ORAL_TABLET | Freq: Every day | ORAL | Status: DC
  Administered 2016-06-06 – 2016-06-07 (×2): 10 mg via ORAL
  Filled 2016-06-05 (×4): qty 1

## 2016-06-05 NOTE — Progress Notes (Signed)
Adult Psychoeducational Group Note  Date:  06/05/2016 Time:  10:37 PM  Group Topic/Focus:  Wrap-Up Group:   The focus of this group is to help patients review their daily goal of treatment and discuss progress on daily workbooks.   Participation Level:  Active  Participation Quality:  Appropriate  Affect:  Blunted  Cognitive:  Alert  Insight: Appropriate  Engagement in Group:  Engaged  Modes of Intervention:  Discussion  Additional Comments:  Pt stated that her goal was to write a letter to her mother, but wasn't able to work on it. She also stated that today was not a good day.  Kaleen OdeaCOOKE, Truxton Stupka R 06/05/2016, 10:37 PM

## 2016-06-05 NOTE — Progress Notes (Signed)
D: Patient denies SI/HI and A/V hallucinations; patient reports sleep is good; reports appetite is fair ; reports energy level is low ; reports ability to concentrate is  Poor; rates depression as 9/10; rates hopelessness 9/10; rates anxiety as 8/10;   A: Monitored q 15 minutes; patient encouraged to attend groups; patient educated about medications; patient given medications per physician orders; patient encouraged to express feelings and/or concerns   R: Patient is anxious; patient has been depressed and sad; patient's interaction with staff and peers has been minimal; patient was able to set goal to talk with staff 1:1 when having feelings of SI; patient is taking medications as prescribed and tolerating medications; patient is attending all groups

## 2016-06-05 NOTE — Plan of Care (Signed)
Problem: Safety: Goal: Periods of time without injury will increase Outcome: Progressing Pt. remains a low fall risk, Q 15 checks in place, denies SI/HI at this time, will cont. to monitor.
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
## 2016-06-05 NOTE — Progress Notes (Addendum)
D: Pt. is up and visible in the milieu. Denies having any SI/HI and AVH at this time, however, Pt. states that she hears "radio frequencies at night". Rates headache as 4/10. Pt. Presents with a flat affect and appears anxious, depressed, and irritable. Pt. states "I am having a bad day;I don't feel too great". Upon conversation, Pt. appears to be having dissociations stating "the floor is moving like waves and it makes me feel dizzy".   A: Encouragement and support given. Meds. ordered and given. PRNs Tylenol & Ativan requested and given. Will re-eval as necessary.   R: Safety maintained with Q 15 checks. Pt. remains a low fall risk. Continues to follow treatment plan and will monitor closely.

## 2016-06-05 NOTE — [facility] Group Notes (Signed)
[facility] LCSW Group Therapy 06/05/2016 1:15 PM  Type of Therapy: Group Therapy- Emotion Regulation  Pt did not attend, declined invitation.    Chad CordialLauren Carter, LCSWA 06/05/2016 4:06 PM

## 2016-06-05 NOTE — Progress Notes (Signed)
Recreation Therapy Notes  Date: 06/05/16 Time: 0930 Location: 300 Hall Group Room  Group Topic: Stress Management  Goal Area(s) Addresses:  Patient will verbalize importance of using healthy stress management.  Patient will identify positive emotions associated with healthy stress management.   Behavioral Response: Engaged  Intervention: Stress Management  Activity :  [employer].  LRT introduced the technique of guided imagery to patients.  Patients were to follow along as LRT read script.  LRT read script so patients could participate in the technique.  Education:  Stress Management, Discharge Planning.   Education Outcome: Acknowledges edcuation/In group clarification offered/Needs additional education  Clinical Observations/Feedback: Pt attended group.   Caroll RancherMarjette Aspasia Rude, LRT/CTRS         Caroll RancherLindsay, Latalia Etzler A 06/05/2016 12:14 PM

## 2016-06-05 NOTE — Progress Notes (Signed)
Pt reports her day has been fair.  She is having headache and sinus pain/pressure and requested Ibuprofen which she was given.  Pt also states that she did not sleep well last night d/t her roommate who was up and down during the night, but was in the bed most of the day appearing to be asleep.  Pt said she did not feel comfortable going into her  room today, afraid that she would disturb her roommate who has been consistently irritable and rude with staff and peers.  Pt states she has gone to groups today.  She is having anxiety about her discharge plans, trying to decide if she needs to go for long term treatment.  She contracts for safety.  She denies HI/AVH.  She makes her needs known to staff.  Pt was moved to another room for her well being and treatment.  Support and encouragement offered.  Discharge plans are in process.  Pt grateful to be moved to another room.  Safety maintained with q15 minute checks.

## 2016-06-06 MED ORDER — FLUCONAZOLE 150 MG PO TABS
150.0000 mg | ORAL_TABLET | Freq: Once | ORAL | Status: AC
  Administered 2016-06-06: 150 mg via ORAL
  Filled 2016-06-06 (×2): qty 1

## 2016-06-06 MED ORDER — MIRTAZAPINE 15 MG PO TABS
15.0000 mg | ORAL_TABLET | Freq: Every day | ORAL | Status: DC
  Administered 2016-06-06 – 2016-06-10 (×5): 15 mg via ORAL
  Filled 2016-06-06 (×7): qty 1

## 2016-06-06 NOTE — [facility] Suicide Risk Assessment (Signed)
[facility] INPATIENT:  Family/Significant Other Suicide Prevention Education  Suicide Prevention Education:  Education Completed; Nicolette Bangicholas Elliot, Pt's husband [phone],  has been identified by the patient as the family member/significant other with whom the patient will be residing, and identified as the person(s) who will aid the patient in the event of a mental health crisis (suicidal ideations/suicide attempt).  With written consent from the patient, the family member/significant other has been provided the following suicide prevention education, prior to the and/or following the discharge of the patient.  The suicide prevention education provided includes the following:  Suicide risk factors  Suicide prevention and interventions  National Suicide Hotline telephone number  [facility] assessment telephone number  [facility] 911  [facility] and/or [facility] telephone number  Request made of family/significant other to:  Remove weapons (e.g., guns, rifles, knives), all items previously/currently identified as safety concern.    Remove drugs/medications (over-the-counter, prescriptions, illicit drugs), all items previously/currently identified as a safety concern.  The family member/significant other verbalizes understanding of the suicide prevention education information provided.  The family member/significant other agrees to remove the items of safety concern listed above.  Elaina HoopsLauren M Carter 06/06/2016, 12:59 PM

## 2016-06-06 NOTE — Progress Notes (Signed)
Patient ID: Karen Yates, female   DOB: Sep 27, 1985, 30 y.o.   MRN: [id number]    Pt currently presents with an anxious affect and behavior. Pt reports to writer that their goal is to "try to get rid of this headache." Pt states "I am trying to relax and want to go to bed early." Pt reports good sleep with current medication regimen. Pt mood noticeably more irritable today. Pt reports mild urinary discomfort from yeast infection.   Pt provided with medications per providers orders. Pt's labs and vitals were monitored throughout the night. Pt supported emotionally and encouraged to express concerns and questions. Pt educated on medications.  Pt's safety ensured with 15 minute and environmental checks. Pt currently denies SI/HI and A/V hallucinations. Pt verbally agrees to seek staff if SI/HI or A/VH occurs and to consult with staff before acting on any harmful thoughts. Will continue POC.

## 2016-06-06 NOTE — [facility] Group Notes (Signed)
[facility] 06/06/2016 1:15pm  Type of Therapy: Mental Health Association Presentation  Participation Level: Active  Participation Quality: Attentive  Affect: Appropriate  Cognitive: Oriented  Insight: Developing/Improving  Engagement in Therapy: Engaged  Modes of Intervention: Discussion, Education and Socialization  Summary of Progress/Problems: Mental Health Association (MHA) Speaker came to talk about his personal journey with substance abuse and addiction. The pt processed ways by which to relate to the speaker. MHA speaker provided handouts and educational information pertaining to groups and services offered by the [facility]. Pt was engaged in speaker's presentation and was receptive to resources provided.    Chad CordialLauren Carter, LCSWA 06/06/2016 1:54 PM

## 2016-06-06 NOTE — Progress Notes (Signed)
Pt attended karaoke group this evening.  
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
## 2016-06-06 NOTE — Progress Notes (Signed)
D: Pt continues to be very flat and depressed on the unit today. Pt has been visible in the day area interacting with peers. Pt concerned about her having yeast infection and doctor made aware. Pt reported that her depression was a 4, his hopelessness was a 6, and that his anxiety was a 8. Pt reported being negative SI/HI, no AH/VH noted. A: 15 min checks continued for patient safety. R: Pts safety maintained.

## 2016-06-06 NOTE — Progress Notes (Signed)
Adult Psychoeducational Group Note  Date:  06/06/2016 Time:  2:19 PM  Group Topic/Focus:  Identifying Needs:   The focus of this group is to help patients identify their personal needs that have been historically problematic and identify healthy behaviors to address their needs.   Participation Level:  Active  Participation Quality:  Appropriate  Affect:  Appropriate  Cognitive:  Alert and Appropriate  Insight: Appropriate, Good and Improving  Engagement in Group:  Engaged  Modes of Intervention:  Discussion  Additional Comments:  Pt was active in group this morning.  Pt States that her triggers include loud noises and flashing lights.  Pt states that her anxiety gets so bad that she will have to leave the grocery store before she finishes shopping.  Pt states that when her anxiety gets bad in a store she will try to find a quite place to go and collect her thoughts,  She says that most of the time she does have to leave the store early because of anxiety attacks.   Karen Yates 06/06/2016, 2:19 PM

## 2016-06-06 NOTE — Progress Notes (Signed)
Adult Psychoeducational Group Note  Date:  06/06/2016 Time:  0900 am  Group Topic/Focus:  Orientation:   The focus of this group is to educate the patient on the purpose and policies of crisis stabilization and provide a format to answer questions about their admission.  The group details unit policies and expectations of patients while admitted.   Participation Level:  Active  Participation Quality:  Appropriate  Affect:  Appropriate  Cognitive:  Appropriate  Insight: Appropriate  Engagement in Group:  Engaged  Modes of Intervention:  Discussion and Orientation  Additional Comments:   Pricilla Moehle L 06/06/2016, 4:55 PM

## 2016-06-07 MED ORDER — LORAZEPAM 0.5 MG PO TABS
0.5000 mg | ORAL_TABLET | Freq: Once | ORAL | Status: AC
  Administered 2016-06-07: 0.5 mg via ORAL

## 2016-06-07 MED ORDER — INFLUENZA VAC SPLIT QUAD 0.5 ML IM SUSY
0.5000 mL | PREFILLED_SYRINGE | INTRAMUSCULAR | Status: AC
  Administered 2016-06-09: 0.5 mL via INTRAMUSCULAR
  Filled 2016-06-07: qty 0.5

## 2016-06-07 MED ORDER — ARIPIPRAZOLE 5 MG PO TABS
5.0000 mg | ORAL_TABLET | Freq: Every day | ORAL | Status: DC
  Administered 2016-06-08 – 2016-06-09 (×2): 5 mg via ORAL
  Filled 2016-06-07 (×4): qty 1

## 2016-06-07 MED ORDER — DULOXETINE HCL 20 MG PO CPEP
20.0000 mg | ORAL_CAPSULE | Freq: Every day | ORAL | Status: DC
  Administered 2016-06-08 – 2016-06-11 (×4): 20 mg via ORAL
  Filled 2016-06-07 (×6): qty 1

## 2016-06-07 NOTE — Progress Notes (Signed)
Recreation Therapy Notes  Date: 06/07/16 Time: 0930 Location: 300 Hall Group Room  Group Topic: Stress Management  Goal Area(s) Addresses:  Patient will verbalize importance of using healthy stress management.  Patient will identify positive emotions associated with healthy stress management.   Behavioral Response: Engaged  Intervention: Stress Management  Activity :  Forest Visualization.  LRT introduced the concept of guided imagery.  LRT read a script to allow patients to participate in activity.  Patients were to follow along as LRT read script.  Education:  Stress Management, Discharge Planning.   Education Outcome: Acknowledges edcuation/In group clarification offered/Needs additional education  Clinical Observations/Feedback:  Pt attended group.   Niraj Kudrna, LRT/CTRS         Orest Dygert A 06/07/2016 12:47 PM 

## 2016-06-07 NOTE — Tx Team (Signed)
Interdisciplinary Treatment and Diagnostic Plan Update  06/07/2016 Time of Session: 11:26 AM  SHEKITA BOYDEN MRN: [id number]  Principal Diagnosis: Bipolar I disorder, most recent episode (or current) depressed, severe, specified as with psychotic behavior  Secondary Diagnoses: Active Problems:   Bipolar I disorder, most recent episode (or current) depressed, severe, specified as with psychotic behavior (HCC)   Current Medications:  Current Facility-Administered Medications  Medication Dose Route Frequency Provider Last Rate Last Dose  . acetaminophen (TYLENOL) tablet 650 mg  650 mg Oral Q6H PRN Kristeen Mans, NP   650 mg at 06/06/16 1328  . ARIPiprazole (ABILIFY) tablet 10 mg  10 mg Oral Daily Craige Cotta, MD   10 mg at 06/07/16 0819  . doxycycline (VIBRA-TABS) tablet 100 mg  100 mg Oral Q12H Craige Cotta, MD   100 mg at 06/07/16 0819  . ibuprofen (ADVIL,MOTRIN) tablet 600 mg  600 mg Oral Q6H PRN Kerry Hough, PA-C   600 mg at 06/06/16 1949  . [START ON 06/08/2016] Influenza vac split quadrivalent PF (FLUARIX) injection 0.5 mL  0.5 mL Intramuscular Tomorrow-1000 Fernando A Cobos, MD      . lamoTRIgine (LAMICTAL) tablet 25 mg  25 mg Oral Daily Craige Cotta, MD   25 mg at 06/07/16 0819  . levothyroxine (SYNTHROID, LEVOTHROID) tablet 75 mcg  75 mcg Oral QAC breakfast Craige Cotta, MD   75 mcg at 06/07/16 [phone]  . LORazepam (ATIVAN) tablet 0.5 mg  0.5 mg Oral Q6H PRN Craige Cotta, MD   0.5 mg at 06/07/16 1007  . mirtazapine (REMERON) tablet 15 mg  15 mg Oral QHS Craige Cotta, MD   15 mg at 06/06/16 2133  . nicotine (NICODERM CQ - dosed in mg/24 hours) patch 14 mg  14 mg Transdermal Daily Craige Cotta, MD   14 mg at 06/07/16 0819  . prazosin (MINIPRESS) capsule 1 mg  1 mg Oral QHS Craige Cotta, MD   1 mg at 06/06/16 2133  . saccharomyces boulardii (FLORASTOR) capsule 250 mg  250 mg Oral BID Kristeen Mans, NP   250 mg at 06/07/16 7846    PTA  Medications: Prescriptions Prior to Admission  Medication Sig Dispense Refill Last Dose  . divalproex (DEPAKOTE ER) 250 MG 24 hr tablet Take 3 tablets (750 mg total) by mouth at bedtime. For mood stabilization 90 tablet 0 Past Week at Unknown time  . doxycycline (DORYX) 100 MG EC tablet Take 100 mg by mouth 2 (two) times daily.   Past Week at Unknown time  . DULoxetine (CYMBALTA) 30 MG capsule Take 3 capsules (90 mg total) by mouth daily. For depression 90 capsule 0 Past Week at Unknown time  . levothyroxine (SYNTHROID, LEVOTHROID) 88 MCG tablet Take 1 tablet (88 mcg total) by mouth daily before breakfast. For low functioning Thyroid gland   Past Week at Unknown time  . naltrexone (DEPADE) 50 MG tablet Take 1 tablet (50 mg total) by mouth daily. For alcohol cravings 30 tablet 0 Past Week at Unknown time  . risperiDONE (RISPERDAL) 1 MG tablet Take 1 tablet (1 mg total) by mouth daily. For mood control 30 tablet 0 Past Week at Unknown time  . traZODone (DESYREL) 50 MG tablet Take 1 tablet (50 mg total) by mouth at bedtime. For sleep 30 tablet 0 Past Week at Unknown time  . albuterol (PROVENTIL HFA;VENTOLIN HFA) 108 (90 Base) MCG/ACT inhaler Inhale 2 puffs into the lungs every 6 (six)  hours as needed for wheezing or shortness of breath.   Unknown at Unknown time  . hydrOXYzine (ATARAX/VISTARIL) 25 MG tablet Take 1 tablet (25 mg total) by mouth every 6 (six) hours as needed for anxiety. 60 tablet 0 Unknown at Unknown time  . ibuprofen (ADVIL,MOTRIN) 600 MG tablet Take 1 tablet (600 mg total) by mouth every 6 (six) hours. For moderate pain 1 tablet 0 Unknown at Unknown time    Treatment Modalities: Medication Management, Group therapy, Case management,  1 to 1 session with clinician, Psychoeducation, Recreational therapy.   Physician Treatment Plan for Primary Diagnosis: Bipolar I disorder, most recent episode (or current) depressed, severe, specified as with psychotic behavior Long Term Goal(s):  Improvement in symptoms so as ready for discharge  Short Term Goals: Ability to identify and develop effective coping behaviors will improve and Ability to maintain clinical measurements within normal limits will improve  Medication Management: Evaluate patient's response, side effects, and tolerance of medication regimen.  Therapeutic Interventions: 1 to 1 sessions, Unit Group sessions and Medication administration.  Evaluation of Outcomes: Progressing  Physician Treatment Plan for Secondary Diagnosis: Active Problems:   Bipolar I disorder, most recent episode (or current) depressed, severe, specified as with psychotic behavior (HCC)   Long Term Goal(s): Improvement in symptoms so as ready for discharge  Short Term Goals: Ability to identify and develop effective coping behaviors will improve and Ability to identify triggers associated with substance abuse/mental health issues will improve  Medication Management: Evaluate patient's response, side effects, and tolerance of medication regimen.  Therapeutic Interventions: 1 to 1 sessions, Unit Group sessions and Medication administration.  Evaluation of Outcomes: Progressing   RN Treatment Plan for Primary Diagnosis: Bipolar I disorder, most recent episode (or current) depressed, severe, specified as with psychotic behavior Long Term Goal(s): Knowledge of disease and therapeutic regimen to maintain health will improve  Short Term Goals: Ability to verbalize feelings will improve, Ability to disclose and discuss suicidal ideas and Ability to identify and develop effective coping behaviors will improve  Medication Management: RN will administer medications as ordered by provider, will assess and evaluate patient's response and provide education to patient for prescribed medication. RN will report any adverse and/or side effects to prescribing provider.  Therapeutic Interventions: 1 on 1 counseling sessions, Psychoeducation, Medication  administration, Evaluate responses to treatment, Monitor vital signs and CBGs as ordered, Perform/monitor CIWA, COWS, AIMS and Fall Risk screenings as ordered, Perform wound care treatments as ordered.  Evaluation of Outcomes: Progressing   LCSW Treatment Plan for Primary Diagnosis: Bipolar I disorder, most recent episode (or current) depressed, severe, specified as with psychotic behavior Long Term Goal(s): Safe transition to appropriate next level of care at discharge, Engage patient in therapeutic group addressing interpersonal concerns.  Short Term Goals: Engage patient in aftercare planning with referrals and resources, Increase emotional regulation, Identify triggers associated with mental health/substance abuse issues and Increase skills for wellness and recovery  Therapeutic Interventions: Assess for all discharge needs, 1 to 1 time with Social worker, Explore available resources and support systems, Assess for adequacy in community support network, Educate family and significant other(s) on suicide prevention, Complete Psychosocial Assessment, Interpersonal group therapy.  Evaluation of Outcomes: Progressing   Progress in Treatment: Attending groups: Yes Participating in groups:Yes Taking medication as prescribed: Yes, MD continues to assess for medication changes as needed Toleration medication: Yes, no side effects reported at this time Family/Significant other contact made: Yes with husband Patient understands diagnosis: Yes AEB seeking help  with depression and anxiety Discussing patient identified problems/goals with staff: Yes Medical problems stabilized or resolved: Yes Denies suicidal/homicidal ideation: Yes Issues/concerns per patient self-inventory: None Other: N/A  New problem(s) identified: None identified at this time.   New Short Term/Long Term Goal(s): None identified at this time.   Discharge Plan or Barriers: Pt will return home and follow-up with outpatient  services.   Reason for Continuation of Hospitalization: Anxiety Depression Hallucinations Medication stabilization Suicidal ideation  Estimated Length of Stay: 2-3 days  Attendees: Patient: 06/07/2016  11:26 AM  Physician: Dr. Jama Flavors 06/07/2016  11:26 AM  Nursing: Perrin Maltese, Aubery Lapping, RN 06/07/2016  11:26 AM  RN Care Manager: Onnie Boer, RN 06/07/2016  11:26 AM  Social Worker: Chad Cordial, LCSW; Belenda Cruise Drinkard, LCSW 06/07/2016  11:26 AM  Recreational Therapist:  06/07/2016  11:26 AM  Other: Armandina Stammer, NP; Gray Bernhardt, NP 06/07/2016  11:26 AM  Other:  06/07/2016  11:26 AM  Other: 06/07/2016  11:26 AM    Scribe for Treatment Team: Elaina Hoops, LCSW 06/07/2016 11:26 AM
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
## 2016-06-07 NOTE — Progress Notes (Signed)
Data. Patient denies HI/AVH. Patient reports SI, "Almost constantly". Is able to contract to be safe on the unit and to come to staff before acting on any SI thoughts/feeling.  Patient had an episode where she became very dizzy at noon, after she came back from the patio. BP and pulse were elevated at, 150/97, p 110 sitting and standing it was 134/115 and pulse 118. MD notified. Patient also reports she, "Lost time. I thought I took my Ativan only minutes ago", when she had taken it 2 hours before. Patient taken to her room to lay down. EKG ordered.  Pushed fluids. VS at 1200 129/93 and pulse 99. VS at 1342, 102/49, pulse 80. VS at 5pm, 124/80, pulse 94-sitting and 125/82, pulse 104-standing 02 sat 100%.Dennie Bible. Pat  Patient interacting well with staff and other patients. On her self inventory patient reports: 8/10 for depression, 4/10 for hopelessness and 9/10 for anxiety. Her goal today is: "Work through anxiety".  Action. Emotional support and encouragement offered. Education provided on medication, indications and side effect. Q 15 minute checks done for safety. Response. Safety on the unit maintained through 15 minute checks.  Medications taken as prescribed. Attended groups.

## 2016-06-07 NOTE — [facility] Group Notes (Signed)
[facility] LCSW Group Therapy 06/07/2016 1:15pm  Type of Therapy: Group Therapy- Feelings Around Relapse and Recovery  Participation Level: Active   Participation Quality:  Appropriate  Affect:  Appropriate  Cognitive: Alert and Oriented   Insight:  Developing   Engagement in Therapy: Developing/Improving and Engaged   Modes of Intervention: Clarification, Confrontation, Discussion, Education, Exploration, Limit-setting, Orientation, Problem-solving, Rapport Building, Dance movement psychotherapisteality Testing, Socialization and Support  Summary of Progress/Problems: The topic for today was feelings about relapse. The group discussed what relapse prevention is to them and identified triggers that they are on the path to relapse. Members also processed their feeling towards relapse and were able to relate to common experiences. Group also discussed coping skills that can be used for relapse prevention.  Pt is able to discuss motivation for preventing relapse and changing risky behaviors in order to fulfill her role as a functioning mother, wife, and member of society. Pt is able to reflect on warning signs, triggers, and countering thoughts for negative self-talk.    Therapeutic Modalities:   Cognitive Behavioral Therapy Solution-Focused Therapy Assertiveness Training Relapse Prevention Therapy    Lamar SprinklesLauren Carter, LCSWA [phone] 06/07/2016 6:30 PM

## 2016-06-08 DIAGNOSIS — F319 Bipolar disorder, unspecified: Secondary | ICD-10-CM

## 2016-06-08 DIAGNOSIS — F4310 Post-traumatic stress disorder, unspecified: Secondary | ICD-10-CM

## 2016-06-08 MED ORDER — CLOTRIMAZOLE 1 % VA CREA
1.0000 | TOPICAL_CREAM | Freq: Every day | VAGINAL | Status: DC
  Administered 2016-06-08 – 2016-06-10 (×3): 1 via TOPICAL
  Filled 2016-06-08 (×2): qty 45
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
## 2016-06-08 NOTE — Progress Notes (Signed)
[facility] Group Notes:  (Nursing/MHT/Case Management/Adjunct)  Date:  06/08/2016  Time:  12:43 AM  Type of Therapy:  Psychoeducational Skills  Participation Level:  Active  Participation Quality:  Appropriate  Affect:  Appropriate  Cognitive:  Appropriate  Insight:  Good  Engagement in Group:  Engaged  Modes of Intervention:  Education  Summary of Progress/Problems: The patient shared with the group that she did not have a very good day overall since she felt like lashing out and because she felt "easily frustrated" with the staff. Patient further explained that she needs a routine and would like to remain busy. As for the theme of the day, her relapse prevention strategy will be to get into a daily routine versus going back to bed upon returning from the cafeteria for meals.   Hazle CocaGOODMAN, Tyjai Charbonnet S 06/08/2016, 12:43 AM

## 2016-06-08 NOTE — Progress Notes (Addendum)
Data. Patient continues to endorse passive SI, but is able to contract for safety on the unit and to come to staff before she acts on any self harm thoughts/feelings. Patient denies HI. Patient is also endorsing still hearing the phone ringing at night. Patient affect remains blunt, but will brighten with interaction. Patient had another couple of episodes where she felt dizzy and became flushed. Dr. Elna BreslowEappen notified. VS WNL and patient taken to her room to lay down. MD states these dizzy and flushing episodes are due to the decrease I her antidepressant, and will pass in a couple of days. patient also C/O painful, swollen, "Down there" ([place]). NP notified of this.  Patient interacting well with staff and other patients. On her self assessment patient reports 7/10 for depression, 6/10 for hopelessness and 8/10 for anxiety. Patient's goal for today is: "Continue letter to mother".  Action. Cream ordered for vaginal yeast infection. Emotional support and encouragement offered. Education provided on medication, indications and side effect. Q 15 minute checks done for safety. Response. Safety on the unit maintained through 15 minute checks.  Medications taken as prescribed. Attended groups. Remained calm and appropriate through out shift.

## 2016-06-08 NOTE — Progress Notes (Signed)
D: Pt at the time of assessment endorsed moderate anxiety; states, "can you believe I became dizzy earlier today and by the time I got better 2 hours later I thought it had only been 5 mins; this mean I lost 2 hours of my life today; that is making me anxious." Pt requested that her 2200 medications be given early; states, " I can't stop thinking about what happen earlier; I don't feel myself right now; I would love to get my night medications now so I can go to bed-V/S WNL. A: Medications offered as prescribed.  Support, encouragement, and safe environment provided.  15-minute safety checks continue. R: Pt was med compliant.  Pt attended group. Safety checks continue

## 2016-06-08 NOTE — [facility] Group Notes (Signed)
[facility] LCSW Group Note  Group session was about Understanding Change and Initiating Change.  Participation - Present.  Began the discussion by having group participants identify Changes they can Control and then Changes they Can't Control. Then revisited with the group the list of Changes that can be Controlled to identify if the Changes are Hard or Easy to control. Particularly focused the idea of Changing on Thinking, Personality and Actions which all participants identified as Hard to change.   1- Gained understanding of the inevitability of Change. 2- Assessed each Change individually. 3- Understand the importance of change or Rewards of change. 4- Identifying one small step toward big Change.  Patient progress - Patient engaged with group throughout. Patient has difficulty engaging in change/progress through condition and is hyperfocused on history of shortcomings. Encouraged reframing throughout.  Beverly Sessionsywan J Elowyn Raupp MSW, LCSW

## 2016-06-08 NOTE — [facility] Group Notes (Signed)
[facility] Group Notes:  (Nursing/MHT/Case Management/Adjunct)  Date:  06/08/2016  Time:  10:48 AM  Type of Therapy:  Nurse Education  Participation Level:  Did Not Attend   Almira Barenny G Tekesha Almgren 06/08/2016, 10:48 AM

## 2016-06-08 NOTE — Progress Notes (Signed)
D: Pt witnessed by Clinical research associatewriter in the dayroom laughing, playing games, and interacting consistently with peers.  A: Nurse and MD notified. Q15 minute checks will continue for pt safety.  R: Pt is safe on the unit at this time.

## 2016-06-09 MED ORDER — ARIPIPRAZOLE 10 MG PO TABS
10.0000 mg | ORAL_TABLET | Freq: Every day | ORAL | Status: DC
  Administered 2016-06-10 – 2016-06-11 (×2): 10 mg via ORAL
  Filled 2016-06-09 (×4): qty 1

## 2016-06-09 NOTE — Plan of Care (Signed)
Problem: Coping: Goal: Ability to cope will improve Outcome: Progressing Patient reports decreased anxiety and more ability and desire to use her coping skills.

## 2016-06-09 NOTE — [facility] Group Notes (Signed)
[facility] Group Notes:  (Nursing/MHT/Case Management/Adjunct)  Date:  06/09/2016  Time:  4:36 PM  Type of Therapy:  Psychoeducational Skills  Participation Level:  Active  Participation Quality:  Appropriate and Attentive  Affect:  Appropriate  Cognitive:  Appropriate  Insight:  Improving  Engagement in Group:  Engaged  Modes of Intervention:  Discussion and Education  Summary of Progress/Problems: Karen Yates attended group and was engaged. She reported that she was trying to keep positive in her thoughts and stay present.   Karen Yates, Nahom Carfagno E 06/09/2016, 4:36 PM

## 2016-06-09 NOTE — Progress Notes (Signed)
Data. Patient denies HI/AVH.  She continues to endorse passive SI, but gives a verbal agreement to come to staff before acting on any self harm thoughts and feelings. PAtient affect blunt and she reports her mood as depressed and anxious. Patient has had no medical issue this shift. Patient interacting well with staff and other patients. On her self assessment patient reports 6/10 for depression, 0/10 for hopelessness and 8/10 for anxiety. Action. Emotional support and encouragement offered. Education provided on medication, indications and side effect. Q 15 minute checks done for safety. Response. Safety on the unit maintained through 15 minute checks. Medications taken as prescribed. Attended groups. Remained calm and appropriate through out shift.
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
## 2016-06-09 NOTE — Progress Notes (Signed)
[facility] Group Notes:  (Nursing/MHT/Case Management/Adjunct)  Date:  06/09/2016  Time:  3:11 AM  Type of Therapy:  Psychoeducational Skills  Participation Level:  Active  Participation Quality:  Appropriate  Affect:  Appropriate  Cognitive:  Appropriate  Insight:  Appropriate  Engagement in Group:  Engaged  Modes of Intervention:  Education  Summary of Progress/Problems: The patient described her day as having been "rough". She states that she is in the process of withdrawing from her medication and that she had a difficult time talking to her husband last evening. As for the theme of the day, her coping skill is to cook.   Trooper Olander S 06/09/2016, 3:11 AM

## 2016-06-09 NOTE — [facility] Group Notes (Signed)
[facility] LCSW Group Therapy  06/09/2016 10:00 AM  Type of Therapy:  Group Therapy  Participation Level:  Active  Participation Quality:  Appropriate  Affect:  Appropriate  Cognitive:  Alert and Oriented  Insight:  Improving  Engagement in Therapy:  Engaged  Modes of Intervention:  Discussion  Summary of Progress/Problems: Group today was about appropriate goal setting beyond treatment. Patients began by visualizing Goals for themselves and then thinking about barriers for those goals. Patients were then asked to identify 1 small thing they can do in order to work on that barrier. Almost all of the group participants could identify an appropriate, manageable short term goal and most of them identified that they had never processed their goals into smaller pieces. Group went on to discuss how to create smaller goals to address larger issues and process the difference between excuses and problems. Patient identified that she can use the gaining of information as a small step in helping with social anxiety.   Beverly Sessionsywan J Aaradhya Kysar 06/09/2016, 5:09 PM

## 2016-06-10 DIAGNOSIS — Z87891 Personal history of nicotine dependence: Secondary | ICD-10-CM

## 2016-06-10 DIAGNOSIS — Z79899 Other long term (current) drug therapy: Secondary | ICD-10-CM

## 2016-06-10 MED ORDER — LAMOTRIGINE 25 MG PO TABS
50.0000 mg | ORAL_TABLET | Freq: Every day | ORAL | Status: DC
  Administered 2016-06-11: 50 mg via ORAL
  Filled 2016-06-10 (×3): qty 2

## 2016-06-10 NOTE — Progress Notes (Signed)
D: Pt was at the nurse's station upon initial approach.  Pt has anxious affect and mood.  Pt reports she has been "trying to write a letter to my mom."  Pt reports she has "started it."  Pt reports "I might go home tomorrow, which makes me anxious."  Pt reports "I feel like I might cut if I go home."  [facility] but states "if I go home I'm afraid I'll do something impulsive."  Pt denies HI, denies hallucinations, reports pain from headache of 3/10.  Pt has been visible in milieu interacting with peers and staff appropriately.  Pt attended evening group.   A: Introduced self to pt.  Actively listened to pt and offered support and encouragement. Medications administered per order.  PRN medication administered for pain and anxiety. R: Pt is compliant with medications.  Pt verbally contracts for safety.  Will continue to monitor and assess.

## 2016-06-10 NOTE — [facility] Group Notes (Signed)
[facility] LCSW Group Therapy  06/10/2016 1:15pm  Type of Therapy:  Group Therapy vercoming Obstacles  Participation Level:  Active  Participation Quality:  Appropriate   Affect:  Flat  Cognitive:  Appropriate and Oriented  Insight:  Developing/Improving and Improving  Engagement in Therapy:  Improving  Modes of Intervention:  Discussion, Exploration, Problem-solving and Support  Description of Group:   In this group patients will be encouraged to explore what they see as obstacles to their own wellness and recovery. They will be guided to discuss their thoughts, feelings, and behaviors related to these obstacles. The group will process together ways to cope with barriers, with attention given to specific choices patients can make. Each patient will be challenged to identify changes they are motivated to make in order to overcome their obstacles. This group will be process-oriented, with patients participating in exploration of their own experiences as well as giving and receiving support and challenge from other group members.  Summary of Patient Progress: Pt participated actively in group today, but input was particurly negative at times with Pt reporting that at this time, she does not feel like recovery is "worth it." Pt reports that she only has two options: to die or to get better. Pt was able to identify that her thinking was not rational today and reports that she would like to work on that.    Therapeutic Modalities:   Cognitive Behavioral Therapy Solution Focused Therapy Motivational Interviewing Relapse Prevention Therapy   Chad CordialLauren Carter, LCSWA 06/10/2016 4:28 PM
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
## 2016-06-10 NOTE — Plan of Care (Signed)
Problem: Coping: Goal: Ability to demonstrate self-control will improve Outcome: Progressing Patient have thoughts to harm self if discharged however has not engaged in that behavior on unit. Able to express/verbalize these thoughts and self regulate emotions.  Problem: Physical Regulation: Goal: Ability to maintain clinical measurements within normal limits will improve Outcome: Progressing Patient VSS.

## 2016-06-10 NOTE — Progress Notes (Signed)
Adult Psychoeducational Group Note  Date:  06/10/2016 Time:  1:11 AM  Group Topic/Focus:  Wrap-Up Group:   The focus of this group is to help patients review their daily goal of treatment and discuss progress on daily workbooks.   Participation Level:  Active  Participation Quality:  Appropriate  Affect:  Appropriate  Cognitive:  Alert, Appropriate and Oriented  Insight: Appropriate  Engagement in Group:  Engaged  Modes of Intervention:  Discussion  Additional Comments: Patient attended group and she said her day was a 4. Her goal for today was to write a letter to her Mom. She started but did not complete.  Her coping skills are playing cards and watching TV.   Ikey Omary W Valary Manahan 06/10/2016, 1:11 AM

## 2016-06-10 NOTE — Progress Notes (Signed)
Adult Psychoeducational Group Note  Date:  06/10/2016 Time:  10:31 PM  Group Topic/Focus:  Wrap-Up Group:   The focus of this group is to help patients review their daily goal of treatment and discuss progress on daily workbooks.   Participation Level:  Active  Participation Quality:  Appropriate  Affect:  Appropriate  Cognitive:  Alert  Insight: Appropriate  Engagement in Group:  Engaged  Modes of Intervention:  Discussion  Additional Comments:  Patient states, "my day was not good. I didn't have a good visit with my husband". Patient's goal for today was to not think about hurting herself. Karen Yates L Kavita Bartl 06/10/2016, 10:31 PM

## 2016-06-10 NOTE — Progress Notes (Signed)
D: Patient up and visible in the milieu. Social with peers in [facility]. Spoke with patient 1:1. Rates sleep poor, appetite good, energy low and concentration poor. Patient's affect anixous, mood anxious and depressed. Rating her depression at an 8 10, hopelessness and anxiety at a 910. States goal for today is to "try to use coping skills to control thoughts. To be social. I'm afraid to go home." complaining of ongoing sinus pain rating it at a 4/10.   A: Medicated per orders, tylenol given prn. Emotional support offered and self inventory reviewed. Reviewed patient with treatment team.  R: On reassess, patient's pain is a 0/10. Patient states, "I know if you discharge me I will go home and kill myself. She verbally contracts for safety. Denies HI and remains safe on level III obs.

## 2016-06-11 ENCOUNTER — Ambulatory Visit (HOSPITAL_COMMUNITY): Payer: Self-pay | Admitting: Licensed Clinical Social Worker

## 2016-06-11 MED ORDER — NICOTINE 14 MG/24HR TD PT24: 14.0000 mg | MEDICATED_PATCH | Freq: Every day | TRANSDERMAL | 0 refills | Status: DC

## 2016-06-11 MED ORDER — SACCHAROMYCES BOULARDII 250 MG PO CAPS: 250.0000 mg | ORAL_CAPSULE | Freq: Two times a day (BID) | ORAL | 0 refills | Status: DC

## 2016-06-11 MED ORDER — ARIPIPRAZOLE 10 MG PO TABS: 10.0000 mg | ORAL_TABLET | Freq: Every day | ORAL | 0 refills | Status: DC

## 2016-06-11 MED ORDER — DULOXETINE HCL 20 MG PO CPEP: 20.0000 mg | ORAL_CAPSULE | Freq: Every day | ORAL | 0 refills | Status: DC

## 2016-06-11 MED ORDER — LAMOTRIGINE 25 MG PO TABS: 50.0000 mg | ORAL_TABLET | Freq: Every day | ORAL | 0 refills | Status: DC

## 2016-06-11 MED ORDER — PRAZOSIN HCL 1 MG PO CAPS: 1.0000 mg | ORAL_CAPSULE | Freq: Every day | ORAL | 0 refills | Status: DC

## 2016-06-11 MED ORDER — LEVOTHYROXINE SODIUM 75 MCG PO TABS: 75.0000 ug | ORAL_TABLET | Freq: Every day | ORAL | 0 refills | Status: DC

## 2016-06-11 MED ORDER — MIRTAZAPINE 15 MG PO TABS: 15.0000 mg | ORAL_TABLET | Freq: Every day | ORAL | 0 refills | Status: DC
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
## 2016-06-11 NOTE — [facility] Group Notes (Signed)
[facility] Group Notes:  (Nursing/MHT/Case Management/Adjunct)  Date:  06/11/2016  Time:  0900 am  Type of Therapy:  Orientation Group  Participation Level:  Active  Participation Quality:  Appropriate  Affect:  Appropriate  Cognitive:  Appropriate  Insight:  Good  Engagement in Group:  Developing/Improving  Modes of Intervention:  Support  Summary of Progress/Problems: Patient anxious about discharge.  She states she has an appointment tomorrow with IOP.  She is also going to a long term treatment facility in [place] hopefully.  Cranford MonBeaudry, Yenifer Saccente Evans 06/11/2016, 10:50 AM

## 2016-06-11 NOTE — [facility] Group Notes (Signed)
Pt attended spiritual care group on grief and loss facilitated by chaplain Burnis KingfisherMatthew Romelia Bromell   Group opened with brief discussion and psycho-social ed around grief and loss in relationships and in relation to self - identifying life patterns, circumstances, changes that cause losses. Established group norm of speaking from own life experience. Group goal of establishing open and affirming space for members to share loss and experience with grief, normalize grief experience and provide psycho social education and grief support.    Marchelle Folksmanda was present throughout group.  Alert and Oriented x4, she was engaged in group discussion.  Expressed expressed feeling past, present, and future grief.  Specifically around loss of children as well as anticipating loss of relationship with mother.  Marchelle Folksmanda was engaged with other members who discussed importance of self care, boundaries, and forgiveness.  Patients also spoke about symptoms of PTSD affecting grief process - Marchelle Folksmanda asked group whether these "ever get better."   Group affirmed that their experience of symptoms change as they are able to cope in more effective ways.

## 2016-06-11 NOTE — [facility] Suicide Risk Assessment (Signed)
[facility]   Principal Problem: <principal problem not specified> Discharge Diagnoses:  Patient Active Problem List   Diagnosis Date Noted  . Bipolar I disorder, most recent episode (or current) depressed, severe, specified as with psychotic behavior (HCC) [F31.5] 06/02/2016  . Bipolar II disorder (HCC) [F31.81] 05/10/2016  . Alcohol use disorder, severe, dependence (HCC) [F10.20] 05/10/2016  . Major depressive disorder, recurrent severe without psychotic features (HCC) [F33.2] 05/09/2016  . Alcohol abuse [F10.10] 05/09/2016  . Major depressive disorder, recurrent episode, severe, with psychosis (HCC) [F33.3] 05/09/2016  . S/P cesarean section [Z98.891] 07/05/2015    Total Time spent with patient: 45 minutes  Musculoskeletal: Strength & Muscle Tone: within normal limits Gait & Station: normal Patient leans: N/A  Psychiatric Specialty Exam: ROS  Blood pressure 102/72, pulse (!) 126, temperature 98.4 F (36.9 C), temperature source Oral, resp. rate 18, height 5\' 7"  (1.702 m), weight 103.4 kg (228 lb), SpO2 100 %, unknown if currently breastfeeding.Body mass index is 35.71 kg/m.  General Appearance: Casual  Eye Contact::  Good  Speech:  Clear and Coherent and Normal Rate409  Volume:  Normal  Mood:  Euthymic  Affect:  Appropriate and Congruent  Thought Process:  Coherent and Goal Directed  Orientation:  Full (Time, Place, and Person)  Thought Content:  Logical  Suicidal Thoughts:  No  Homicidal Thoughts:  No  Memory:  Immediate;   Fair Recent;   Fair Remote;   Fair  Judgement:  Fair  Insight:  Fair  Psychomotor Activity:  Normal  Concentration:  Fair  Recall:  FiservFair  Fund of Knowledge:Fair  Language: Fair  Akathisia:  No  Handed:  Right  AIMS (if indicated):     Assets:  Communication Skills Desire for Improvement Financial Resources/Insurance Housing Intimacy Physical Health Social Support Transportation  Sleep:  Number of Hours: 6.75   Cognition: WNL  ADL's:  Intact   Mental Status Per Nursing Assessment::   On Admission:  Suicidal ideation indicated by patient  Demographic Factors:  Caucasian  Loss Factors: NA  Historical Factors: Prior suicide attempts and Family history of mental illness or substance abuse  Risk Reduction Factors:   Responsible for children under 29 years of age, Sense of responsibility to family, Living with another person, especially a relative, Positive social support and Positive therapeutic relationship  Continued Clinical Symptoms:  Personality Disorder Traits  Cognitive Features That Contribute To Risk:  None    Suicide Risk:  Minimal: No identifiable suicidal ideation.  Patients presenting with no risk factors but with morbid ruminations; may be classified as minimal risk based on the severity of the depressive symptoms  Follow-up Information    BEHAVIORAL HEALTH INTENSIVE PSYCH Follow up on 06/12/2016.   Specialty:  Behavioral Health Why:  at 8:45am with [place] SinkRita to start the Intensive Outpatient Program Contact information: [address] [id number] mc [address] [phone]          Plan Of Care/Follow-up recommendations:  Activity:  patient will [place] IOP tomorrow and continue taking medications as prescribed  Wynelle BourgeoisUreh N Lekauwa, MD 06/11/2016, 2:05 PM

## 2016-06-11 NOTE — Progress Notes (Signed)
  [facility] Dis[facility] :  Will you be returning to the same living situation after discharge:  Yes,  Pt returning home At discharge, do you have transportation home?: Yes,  Pt husband to pick up Do you have the ability to pay for your medications: Yes,  Pt provided with prescriptions  Release of information consent forms completed and in the chart;  Patient's signature needed at discharge.  Patient to Follow up at: Follow-up Information    BEHAVIORAL HEALTH INTENSIVE PSYCH Follow up on 06/12/2016.   Specialty:  Behavioral Health Why:  at 8:45am with [place] SinkRita to start the Intensive Outpatient Program Contact information: [address] [id number] [place] [phone]       [facility] up on 06/24/2016.   Why:  at 3:00pm for medication management with Everrett Coombeody Matthews. Contact information: [address]  [place], [place] [address] Phone: [phone]) [phone]  Fax: [phone]          Next level of care provider has access to [facility] Link:no  Safety Planning and Suicide Prevention discussed: Yes,  with husband; see SPE note  Have you used any form of tobacco in the last 30 days? (Cigarettes, Smokeless Tobacco, Cigars, and/or Pipes): Yes  Has patient been referred to the Quitline?: Patient refused referral  Patient has been referred for addiction treatment: Yes  Karen Yates 06/11/2016, 3:31 PM

## 2016-06-11 NOTE — Progress Notes (Signed)
D: Pt A & O X4. Denies SI, HI, AVH and pain at this time. Pt d/c home as ordered and was picked up in the lobby by her husband.  A: Support and availability provided to pt. All scheduled medications administered as prescribed. D/C instructions reviewed with pt including prescriptions and follow up appointments. All belongings in locker 49 returned to pt at time of departure. Q 15 minutes checks maintained for safety till time of departure.  R: Pt receptive to care. Compliant with medications as ordered, denies adverse drug reactions when assessed. Verbalized understanding related to d/c instructions. Signed belonging sheet in agreement with items received from locker. Safety maintained on and off unit till time of d/c. No physical distress evident at present.

## 2016-06-11 NOTE — Progress Notes (Signed)
D: Pt endorses moderate anxiety and depression; states, "I had a bad visit from my husband." Pt was observed in the dayroom withdrawn to self. Pt also endorses passive SI however, contracts for safety. Pt denied pain, HI or AVH. Pt remained calm and cooperative. A: Medications offered as prescribed.  Support, encouragement, and safe environment provided.  15-minute safety checks continue. R: Pt was med compliant.  Pt attended wrap-up group. Safety checks continue.
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
## 2016-06-12 ENCOUNTER — Encounter (HOSPITAL_COMMUNITY): Payer: Self-pay | Admitting: Psychiatry

## 2016-06-12 ENCOUNTER — Other Ambulatory Visit (HOSPITAL_COMMUNITY): Payer: PRIVATE HEALTH INSURANCE | Attending: Psychiatry | Admitting: Psychiatry

## 2016-06-12 DIAGNOSIS — F332 Major depressive disorder, recurrent severe without psychotic features: Secondary | ICD-10-CM | POA: Diagnosis not present

## 2016-06-12 DIAGNOSIS — F333 Major depressive disorder, recurrent, severe with psychotic symptoms: Secondary | ICD-10-CM

## 2016-06-12 DIAGNOSIS — F4310 Post-traumatic stress disorder, unspecified: Secondary | ICD-10-CM | POA: Diagnosis not present
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
## 2016-06-13 ENCOUNTER — Other Ambulatory Visit (HOSPITAL_COMMUNITY): Payer: PRIVATE HEALTH INSURANCE | Admitting: Psychiatry

## 2016-06-13 DIAGNOSIS — F332 Major depressive disorder, recurrent severe without psychotic features: Secondary | ICD-10-CM | POA: Diagnosis not present

## 2016-06-13 DIAGNOSIS — F333 Major depressive disorder, recurrent, severe with psychotic symptoms: Secondary | ICD-10-CM

## 2016-06-14 ENCOUNTER — Other Ambulatory Visit (HOSPITAL_COMMUNITY): Payer: PRIVATE HEALTH INSURANCE | Admitting: Psychiatry

## 2016-06-14 ENCOUNTER — Telehealth (HOSPITAL_COMMUNITY): Payer: Self-pay | Admitting: Psychiatry

## 2016-06-14 NOTE — Progress Notes (Signed)
    Daily Group Progress Note  Program: IOP  Group Time: 9:00-12:00   Participation Level:active   Behavioral Response: responsive   Type of Therapy:  group therapy   Summary of Progress: This was the clients first day in group, she was with the social worker, and saw the psychiatrist. She shared that she has been 36 days sober, and in the absence of alcohol cuts. Client reports enjoying cooking and music: that these are her healthy coping mechanisms.     Shaune PollackBrown, Lavere Stork B, LPC

## 2016-06-17 ENCOUNTER — Other Ambulatory Visit (HOSPITAL_COMMUNITY): Payer: BLUE CROSS/BLUE SHIELD

## 2016-06-17 NOTE — Progress Notes (Signed)
    Daily Group Progress Note  Program: IOP  Group Time: 9:00-12:00  Participation Level: Active  Behavioral Response: Appropriate  Type of Therapy:  Group Therapy  Summary of Progress: Pt. Presented with bright affect, talkative, engaged in group process. Pt. Discussed challenges in family and marriage and making peace with her husband and inlaws. Pt. Participated in theme about developing trust for self and helped to identify importance of 1- taking risks, 2- being honest with oneself, 3-making some mistakes, 4- not being afraid to fail, 5- and having the courage to live your life for yourself on your own terms.       Shaune PollackBrown, Jennifer B, LPC

## 2016-06-18 ENCOUNTER — Other Ambulatory Visit (HOSPITAL_COMMUNITY): Payer: BLUE CROSS/BLUE SHIELD

## 2016-06-18 ENCOUNTER — Telehealth (HOSPITAL_COMMUNITY): Payer: Self-pay | Admitting: Psychiatry
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
## 2016-06-19 ENCOUNTER — Other Ambulatory Visit (HOSPITAL_COMMUNITY): Payer: BLUE CROSS/BLUE SHIELD

## 2016-06-20 ENCOUNTER — Other Ambulatory Visit (HOSPITAL_COMMUNITY): Payer: BLUE CROSS/BLUE SHIELD

## 2016-06-21 ENCOUNTER — Other Ambulatory Visit (HOSPITAL_COMMUNITY): Payer: BLUE CROSS/BLUE SHIELD

## 2016-06-24 ENCOUNTER — Other Ambulatory Visit (HOSPITAL_COMMUNITY): Payer: BLUE CROSS/BLUE SHIELD
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
## 2016-06-25 ENCOUNTER — Other Ambulatory Visit (HOSPITAL_COMMUNITY): Payer: BLUE CROSS/BLUE SHIELD

## 2016-06-25 NOTE — Progress Notes (Signed)
Patient ID: Karen Yates, female   DOB: 03-16-1986, 30 y.o.   MRN: [id number] IOP Discharge note: Date of discharge: 06/25/2016  Ms Mechele Collinlliott did not attend enough sessions to make a difference.  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

## 2016-06-26 ENCOUNTER — Other Ambulatory Visit (HOSPITAL_COMMUNITY): Payer: BLUE CROSS/BLUE SHIELD

## 2016-06-27 ENCOUNTER — Other Ambulatory Visit (HOSPITAL_COMMUNITY): Payer: BLUE CROSS/BLUE SHIELD

## 2016-06-28 ENCOUNTER — Other Ambulatory Visit (HOSPITAL_COMMUNITY): Payer: BLUE CROSS/BLUE SHIELD

## 2016-07-01 ENCOUNTER — Other Ambulatory Visit (HOSPITAL_COMMUNITY): Payer: BLUE CROSS/BLUE SHIELD

## 2016-07-02 ENCOUNTER — Other Ambulatory Visit (HOSPITAL_COMMUNITY): Payer: BLUE CROSS/BLUE SHIELD

## 2016-07-03 ENCOUNTER — Other Ambulatory Visit (HOSPITAL_COMMUNITY): Payer: BLUE CROSS/BLUE SHIELD

## 2016-07-04 ENCOUNTER — Other Ambulatory Visit (HOSPITAL_COMMUNITY): Payer: BLUE CROSS/BLUE SHIELD

## 2016-07-05 ENCOUNTER — Other Ambulatory Visit (HOSPITAL_COMMUNITY): Payer: BLUE CROSS/BLUE SHIELD

## 2016-07-08 ENCOUNTER — Other Ambulatory Visit (HOSPITAL_COMMUNITY): Payer: BLUE CROSS/BLUE SHIELD

## 2016-07-09 ENCOUNTER — Other Ambulatory Visit (HOSPITAL_COMMUNITY): Payer: BLUE CROSS/BLUE SHIELD

## 2016-07-10 ENCOUNTER — Other Ambulatory Visit (HOSPITAL_COMMUNITY): Payer: BLUE CROSS/BLUE SHIELD

## 2016-07-11 ENCOUNTER — Other Ambulatory Visit (HOSPITAL_COMMUNITY): Payer: BLUE CROSS/BLUE SHIELD

## 2016-07-12 ENCOUNTER — Other Ambulatory Visit (HOSPITAL_COMMUNITY): Payer: BLUE CROSS/BLUE SHIELD

## 2016-07-15 ENCOUNTER — Other Ambulatory Visit (HOSPITAL_COMMUNITY): Payer: BLUE CROSS/BLUE SHIELD

## 2016-07-16 ENCOUNTER — Other Ambulatory Visit (HOSPITAL_COMMUNITY): Payer: BLUE CROSS/BLUE SHIELD

## 2016-07-17 ENCOUNTER — Other Ambulatory Visit (HOSPITAL_COMMUNITY): Payer: BLUE CROSS/BLUE SHIELD

## 2016-07-18 ENCOUNTER — Other Ambulatory Visit (HOSPITAL_COMMUNITY): Payer: BLUE CROSS/BLUE SHIELD

## 2016-07-19 ENCOUNTER — Other Ambulatory Visit (HOSPITAL_COMMUNITY): Payer: BLUE CROSS/BLUE SHIELD

## 2016-07-22 ENCOUNTER — Other Ambulatory Visit (HOSPITAL_COMMUNITY): Payer: BLUE CROSS/BLUE SHIELD

## 2016-07-23 ENCOUNTER — Other Ambulatory Visit (HOSPITAL_COMMUNITY): Payer: BLUE CROSS/BLUE SHIELD

## 2016-07-24 ENCOUNTER — Other Ambulatory Visit (HOSPITAL_COMMUNITY): Payer: BLUE CROSS/BLUE SHIELD

## 2016-07-25 ENCOUNTER — Other Ambulatory Visit (HOSPITAL_COMMUNITY): Payer: BLUE CROSS/BLUE SHIELD

## 2016-07-26 ENCOUNTER — Other Ambulatory Visit (HOSPITAL_COMMUNITY): Payer: BLUE CROSS/BLUE SHIELD

## 2016-07-29 ENCOUNTER — Other Ambulatory Visit (HOSPITAL_COMMUNITY): Payer: BLUE CROSS/BLUE SHIELD

## 2016-07-30 ENCOUNTER — Other Ambulatory Visit (HOSPITAL_COMMUNITY): Payer: BLUE CROSS/BLUE SHIELD

## 2016-07-31 ENCOUNTER — Other Ambulatory Visit (HOSPITAL_COMMUNITY): Payer: BLUE CROSS/BLUE SHIELD

## 2016-08-01 ENCOUNTER — Other Ambulatory Visit (HOSPITAL_COMMUNITY): Payer: BLUE CROSS/BLUE SHIELD

## 2016-08-02 ENCOUNTER — Other Ambulatory Visit (HOSPITAL_COMMUNITY): Payer: BLUE CROSS/BLUE SHIELD

## 2016-08-05 ENCOUNTER — Other Ambulatory Visit (HOSPITAL_COMMUNITY): Payer: BLUE CROSS/BLUE SHIELD

## 2016-08-06 ENCOUNTER — Other Ambulatory Visit (HOSPITAL_COMMUNITY): Payer: BLUE CROSS/BLUE SHIELD

## 2016-09-22 ENCOUNTER — Emergency Department (HOSPITAL_COMMUNITY)
Admission: EM | Admit: 2016-09-22 | Discharge: 2016-09-23 | Disposition: A | Discharge status: Other Acute Inpatient Hospital | Payer: Medicaid Other | Attending: Emergency Medicine | Admitting: Emergency Medicine

## 2016-09-22 ENCOUNTER — Encounter (HOSPITAL_COMMUNITY): Payer: Self-pay | Admitting: Nurse Practitioner

## 2016-09-22 DIAGNOSIS — T50902A Poisoning by unspecified drugs, medicaments and biological substances, intentional self-harm, initial encounter: Secondary | ICD-10-CM

## 2016-09-22 DIAGNOSIS — Z79899 Other long term (current) drug therapy: Secondary | ICD-10-CM | POA: Insufficient documentation

## 2016-09-22 DIAGNOSIS — F32A Depression, unspecified: Secondary | ICD-10-CM

## 2016-09-22 DIAGNOSIS — F332 Major depressive disorder, recurrent severe without psychotic features: Secondary | ICD-10-CM

## 2016-09-22 DIAGNOSIS — T426X2A Poisoning by other antiepileptic and sedative-hypnotic drugs, intentional self-harm, initial encounter: Secondary | ICD-10-CM | POA: Insufficient documentation

## 2016-09-22 DIAGNOSIS — T43592A Poisoning by other antipsychotics and neuroleptics, intentional self-harm, initial encounter: Secondary | ICD-10-CM | POA: Insufficient documentation

## 2016-09-22 DIAGNOSIS — Z87891 Personal history of nicotine dependence: Secondary | ICD-10-CM | POA: Insufficient documentation

## 2016-09-22 DIAGNOSIS — E039 Hypothyroidism, unspecified: Secondary | ICD-10-CM | POA: Insufficient documentation

## 2016-09-22 DIAGNOSIS — F329 Major depressive disorder, single episode, unspecified: Secondary | ICD-10-CM

## 2016-09-22 DIAGNOSIS — J45909 Unspecified asthma, uncomplicated: Secondary | ICD-10-CM | POA: Insufficient documentation

## 2016-09-22 LAB — COMPREHENSIVE METABOLIC PANEL
ALBUMIN: 4.3 g/dL (ref 3.5–5.0)
ALK PHOS: 53 U/L (ref 38–126)
ALT: 28 U/L (ref 14–54)
ANION GAP: 13 (ref 5–15)
AST: 31 U/L (ref 15–41)
BILIRUBIN TOTAL: 0.6 mg/dL (ref 0.3–1.2)
BUN: 8 mg/dL (ref 6–20)
CALCIUM: 8.4 mg/dL — AB (ref 8.9–10.3)
CO2: 17 mmol/L — AB (ref 22–32)
CREATININE: 0.53 mg/dL (ref 0.44–1.00)
Chloride: 104 mmol/L (ref 101–111)
GFR calc Af Amer: >60 mL/min (ref >60)
GFR calc non Af Amer: >60 mL/min (ref >60)
GLUCOSE: 160 mg/dL — AB (ref 65–99)
Potassium: 3.2 mmol/L — ABNORMAL LOW (ref 3.5–5.1)
SODIUM: 134 mmol/L — AB (ref 135–145)
TOTAL PROTEIN: 7.3 g/dL (ref 6.5–8.1)

## 2016-09-22 LAB — CBC
HEMATOCRIT: 35.7 % — AB (ref 36.0–46.0)
Hemoglobin: 11.8 g/dL — ABNORMAL LOW (ref 12.0–15.0)
MCH: 28.3 pg (ref 26.0–34.0)
MCHC: 33.1 g/dL (ref 30.0–36.0)
MCV: 85.6 fL (ref 78.0–100.0)
Platelets: 324 10*3/uL (ref 150–400)
RBC: 4.17 MIL/uL (ref 3.87–5.11)
RDW: 15.6 % — AB (ref 11.5–15.5)
WBC: 11.9 10*3/uL — AB (ref 4.0–10.5)

## 2016-09-22 LAB — I-STAT BETA HCG BLOOD, ED (MC, WL, AP ONLY)

## 2016-09-22 LAB — ACETAMINOPHEN LEVEL

## 2016-09-22 LAB — ETHANOL: Alcohol, Ethyl (B): 113 mg/dL — ABNORMAL HIGH (ref <5)

## 2016-09-22 LAB — SALICYLATE LEVEL

## 2016-09-22 MED ORDER — POTASSIUM CHLORIDE 10 MEQ/100ML IV SOLN
10.0000 meq | INTRAVENOUS | Status: AC
  Administered 2016-09-23 (×3): 10 meq via INTRAVENOUS
  Filled 2016-09-22 (×3): qty 100

## 2016-09-22 MED ORDER — SODIUM CHLORIDE 0.9 % IV SOLN: 30.0000 meq | Freq: Once | INTRAVENOUS | Status: DC

## 2016-09-22 MED ORDER — SODIUM CHLORIDE 0.9 % IV BOLUS (SEPSIS)
1000.0000 mL | Freq: Once | INTRAVENOUS | Status: AC
  Administered 2016-09-22: 1000 mL via INTRAVENOUS

## 2016-09-22 MED ORDER — METOCLOPRAMIDE HCL 5 MG/ML IJ SOLN
10.0000 mg | Freq: Once | INTRAMUSCULAR | Status: AC
  Administered 2016-09-22: 10 mg via INTRAVENOUS
  Filled 2016-09-22: qty 2

## 2016-09-22 NOTE — ED Notes (Signed)
Bed: NW29WA13 Expected date:  Expected time:  Means of arrival:  Comments: 31 yo F- SI

## 2016-09-22 NOTE — ED Notes (Signed)
Patty from Poison Control recommendations: With Gabapentin watch out for CNS depression With seroquel watch out for CNS depression, confusion, & agitation Monitor for inc HR, dec BP, conduction delays (keep her on cardiac monitor), if on EKG the QTC is >500 look at her labs to determine treatment (optimize her electrolyte levels- particularly mag and K+) Give IV fluids for the tachycardia, otherwise give supportive care Watch pt for 6 hours then reassess (XR seroquil) Get tylenol and aspirin levels MD aware
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
## 2016-09-22 NOTE — ED Triage Notes (Signed)
Pt reportedly took 15 (400mg ) gabapentin pills and 8 tabs of Seroquel in a suicide attempt. This was after she stated "her life is a mess." Reports suicidal ideation, currently having intermittent episode of vomiting and abdominal pain. Transporting medics administered 4mg  of Zofran IV. Poison control to be notified.

## 2016-09-23 ENCOUNTER — Inpatient Hospital Stay (HOSPITAL_COMMUNITY)
Admission: AD | Admit: 2016-09-23 | Discharge: 2016-09-30 | DRG: 885 | Disposition: A | Discharge status: Home / Self Care | Payer: Federal, State, Local not specified - Other | Source: Intra-hospital | Attending: Psychiatry | Admitting: Psychiatry

## 2016-09-23 ENCOUNTER — Encounter (HOSPITAL_COMMUNITY): Payer: Self-pay

## 2016-09-23 DIAGNOSIS — Z880 Allergy status to penicillin: Secondary | ICD-10-CM

## 2016-09-23 DIAGNOSIS — F410 Panic disorder [episodic paroxysmal anxiety] without agoraphobia: Secondary | ICD-10-CM | POA: Diagnosis present

## 2016-09-23 DIAGNOSIS — F315 Bipolar disorder, current episode depressed, severe, with psychotic features: Secondary | ICD-10-CM

## 2016-09-23 DIAGNOSIS — T426X2A Poisoning by other antiepileptic and sedative-hypnotic drugs, intentional self-harm, initial encounter: Secondary | ICD-10-CM | POA: Diagnosis present

## 2016-09-23 DIAGNOSIS — F411 Generalized anxiety disorder: Secondary | ICD-10-CM | POA: Diagnosis present

## 2016-09-23 DIAGNOSIS — J45909 Unspecified asthma, uncomplicated: Secondary | ICD-10-CM | POA: Diagnosis present

## 2016-09-23 DIAGNOSIS — Z915 Personal history of self-harm: Secondary | ICD-10-CM

## 2016-09-23 DIAGNOSIS — T43592A Poisoning by other antipsychotics and neuroleptics, intentional self-harm, initial encounter: Secondary | ICD-10-CM | POA: Diagnosis present

## 2016-09-23 DIAGNOSIS — K589 Irritable bowel syndrome without diarrhea: Secondary | ICD-10-CM | POA: Diagnosis present

## 2016-09-23 DIAGNOSIS — H04123 Dry eye syndrome of bilateral lacrimal glands: Secondary | ICD-10-CM | POA: Diagnosis present

## 2016-09-23 DIAGNOSIS — Z23 Encounter for immunization: Secondary | ICD-10-CM | POA: Diagnosis not present

## 2016-09-23 DIAGNOSIS — Z818 Family history of other mental and behavioral disorders: Secondary | ICD-10-CM | POA: Diagnosis not present

## 2016-09-23 DIAGNOSIS — Z87891 Personal history of nicotine dependence: Secondary | ICD-10-CM | POA: Diagnosis not present

## 2016-09-23 DIAGNOSIS — R4587 Impulsiveness: Secondary | ICD-10-CM | POA: Diagnosis present

## 2016-09-23 DIAGNOSIS — Z91018 Allergy to other foods: Secondary | ICD-10-CM

## 2016-09-23 DIAGNOSIS — Z9889 Other specified postprocedural states: Secondary | ICD-10-CM | POA: Diagnosis not present

## 2016-09-23 DIAGNOSIS — F1020 Alcohol dependence, uncomplicated: Secondary | ICD-10-CM | POA: Diagnosis not present

## 2016-09-23 DIAGNOSIS — F332 Major depressive disorder, recurrent severe without psychotic features: Secondary | ICD-10-CM | POA: Diagnosis present

## 2016-09-23 DIAGNOSIS — F4000 Agoraphobia, unspecified: Secondary | ICD-10-CM | POA: Diagnosis present

## 2016-09-23 DIAGNOSIS — Z79899 Other long term (current) drug therapy: Secondary | ICD-10-CM

## 2016-09-23 DIAGNOSIS — Z62810 Personal history of physical and sexual abuse in childhood: Secondary | ICD-10-CM | POA: Diagnosis present

## 2016-09-23 DIAGNOSIS — Z811 Family history of alcohol abuse and dependence: Secondary | ICD-10-CM | POA: Diagnosis not present

## 2016-09-23 DIAGNOSIS — F4310 Post-traumatic stress disorder, unspecified: Secondary | ICD-10-CM | POA: Diagnosis present

## 2016-09-23 DIAGNOSIS — Y905 Blood alcohol level of 100-119 mg/100 ml: Secondary | ICD-10-CM | POA: Diagnosis present

## 2016-09-23 DIAGNOSIS — E039 Hypothyroidism, unspecified: Secondary | ICD-10-CM | POA: Diagnosis present

## 2016-09-23 DIAGNOSIS — E739 Lactose intolerance, unspecified: Secondary | ICD-10-CM | POA: Diagnosis present

## 2016-09-23 LAB — RAPID URINE DRUG SCREEN, HOSP PERFORMED
Amphetamines: NOT DETECTED
BARBITURATES: NOT DETECTED
Benzodiazepines: NOT DETECTED
Cocaine: NOT DETECTED
Opiates: NOT DETECTED
TETRAHYDROCANNABINOL: NOT DETECTED

## 2016-09-23 LAB — MAGNESIUM: Magnesium: 2 mg/dL (ref 1.7–2.4)

## 2016-09-23 MED ORDER — LORAZEPAM 1 MG PO TABS
1.0000 mg | ORAL_TABLET | Freq: Four times a day (QID) | ORAL | Status: DC | PRN
  Administered 2016-09-23: 1 mg via ORAL
  Filled 2016-09-23: qty 1

## 2016-09-23 MED ORDER — SODIUM CHLORIDE 0.9 % IV BOLUS (SEPSIS)
1000.0000 mL | Freq: Once | INTRAVENOUS | Status: AC
  Administered 2016-09-23: 1000 mL via INTRAVENOUS

## 2016-09-23 MED ORDER — THIAMINE HCL 100 MG/ML IJ SOLN: 100.0000 mg | Freq: Every day | INTRAMUSCULAR | Status: DC

## 2016-09-23 MED ORDER — LEVOTHYROXINE SODIUM 88 MCG PO TABS
88.0000 ug | ORAL_TABLET | Freq: Every day | ORAL | Status: DC
  Administered 2016-09-23: 88 ug via ORAL
  Filled 2016-09-23 (×2): qty 1

## 2016-09-23 MED ORDER — ALUM & MAG HYDROXIDE-SIMETH 200-200-20 MG/5ML PO SUSP: 30.0000 mL | ORAL | Status: DC | PRN

## 2016-09-23 MED ORDER — VITAMIN B-1 100 MG PO TABS
100.0000 mg | ORAL_TABLET | Freq: Every day | ORAL | Status: DC
  Administered 2016-09-24: 100 mg via ORAL
  Filled 2016-09-23 (×3): qty 1

## 2016-09-23 MED ORDER — LORAZEPAM 1 MG PO TABS: 0.0000 mg | ORAL_TABLET | Freq: Two times a day (BID) | ORAL | Status: DC

## 2016-09-23 MED ORDER — NICOTINE 21 MG/24HR TD PT24
21.0000 mg | MEDICATED_PATCH | Freq: Every day | TRANSDERMAL | Status: DC
  Administered 2016-09-24 – 2016-09-27 (×4): 21 mg via TRANSDERMAL
  Filled 2016-09-23 (×6): qty 1

## 2016-09-23 MED ORDER — QUETIAPINE FUMARATE 100 MG PO TABS: 100.0000 mg | ORAL_TABLET | Freq: Three times a day (TID) | ORAL | Status: DC

## 2016-09-23 MED ORDER — QUETIAPINE FUMARATE 50 MG PO TABS: 50.0000 mg | ORAL_TABLET | Freq: Every day | ORAL | Status: DC

## 2016-09-23 MED ORDER — TRAZODONE HCL 50 MG PO TABS
50.0000 mg | ORAL_TABLET | Freq: Every evening | ORAL | Status: DC | PRN
  Administered 2016-09-23 (×2): 50 mg via ORAL
  Filled 2016-09-23 (×6): qty 1

## 2016-09-23 MED ORDER — ONDANSETRON HCL 4 MG PO TABS: 4.0000 mg | ORAL_TABLET | Freq: Three times a day (TID) | ORAL | Status: DC | PRN

## 2016-09-23 MED ORDER — PAROXETINE HCL 10 MG PO TABS
10.0000 mg | ORAL_TABLET | Freq: Every day | ORAL | Status: DC
  Administered 2016-09-23: 10 mg via ORAL
  Filled 2016-09-23: qty 1

## 2016-09-23 MED ORDER — PAROXETINE HCL 10 MG PO TABS
10.0000 mg | ORAL_TABLET | Freq: Every day | ORAL | Status: DC
  Administered 2016-09-24 – 2016-09-30 (×7): 10 mg via ORAL
  Filled 2016-09-23 (×8): qty 1
  Filled 2016-09-23: qty 14

## 2016-09-23 MED ORDER — LORAZEPAM 1 MG PO TABS
1.0000 mg | ORAL_TABLET | Freq: Four times a day (QID) | ORAL | Status: DC | PRN
Start: 2016-09-23 — End: 2016-09-24
  Administered 2016-09-23 – 2016-09-24 (×2): 1 mg via ORAL
  Filled 2016-09-23 (×2): qty 1

## 2016-09-23 MED ORDER — LEVOTHYROXINE SODIUM 88 MCG PO TABS
88.0000 ug | ORAL_TABLET | Freq: Every day | ORAL | Status: DC
  Administered 2016-09-24 – 2016-09-30 (×7): 88 ug via ORAL
  Filled 2016-09-23 (×7): qty 1
  Filled 2016-09-23: qty 14
  Filled 2016-09-23 (×2): qty 1

## 2016-09-23 MED ORDER — IBUPROFEN 200 MG PO TABS: 600.0000 mg | ORAL_TABLET | Freq: Three times a day (TID) | ORAL | Status: DC | PRN

## 2016-09-23 MED ORDER — VITAMIN B-1 100 MG PO TABS
100.0000 mg | ORAL_TABLET | Freq: Every day | ORAL | Status: DC
  Administered 2016-09-23: 100 mg via ORAL
  Filled 2016-09-23: qty 1

## 2016-09-23 MED ORDER — ONDANSETRON HCL 4 MG PO TABS
4.0000 mg | ORAL_TABLET | Freq: Three times a day (TID) | ORAL | Status: DC | PRN
  Administered 2016-09-29 – 2016-09-30 (×2): 4 mg via ORAL
  Filled 2016-09-23 (×2): qty 1

## 2016-09-23 MED ORDER — MAGNESIUM HYDROXIDE 400 MG/5ML PO SUSP: 30.0000 mL | Freq: Every day | ORAL | Status: DC | PRN

## 2016-09-23 MED ORDER — LORAZEPAM 1 MG PO TABS
0.0000 mg | ORAL_TABLET | Freq: Four times a day (QID) | ORAL | Status: DC
Start: 2016-09-23 — End: 2016-09-23
  Administered 2016-09-23: 2 mg via ORAL
  Filled 2016-09-23: qty 2

## 2016-09-23 MED ORDER — ACETAMINOPHEN 325 MG PO TABS
650.0000 mg | ORAL_TABLET | ORAL | Status: DC | PRN
  Administered 2016-09-28 – 2016-09-29 (×2): 650 mg via ORAL
  Filled 2016-09-23 (×2): qty 2

## 2016-09-23 MED ORDER — ACETAMINOPHEN 325 MG PO TABS
650.0000 mg | ORAL_TABLET | ORAL | Status: DC | PRN
  Administered 2016-09-23 (×2): 650 mg via ORAL
  Filled 2016-09-23 (×2): qty 2

## 2016-09-23 MED ORDER — IBUPROFEN 600 MG PO TABS
600.0000 mg | ORAL_TABLET | Freq: Three times a day (TID) | ORAL | Status: DC | PRN
  Administered 2016-09-24 – 2016-09-29 (×4): 600 mg via ORAL
  Filled 2016-09-23 (×4): qty 1

## 2016-09-23 NOTE — ED Notes (Signed)
SBAR Report received from previous nurse. Pt received calm and visible on unit. Pt denies current HI, A/V H, or pain at this time, but endorses intermittent anxiety, and depression with SI  and appears otherwise stable and free of distress. Pt reminded of camera surveillance, q 15 min rounds, and rules of the milieu. Will continue to assess.
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
## 2016-09-23 NOTE — Progress Notes (Signed)
Patient ID: Karen Yates, female   DOB: June 12, 1986, 31 y.o.   MRN: [id number]   Report accepted from admitting nurse Herbert SetaHeather, RN. Pt currently presents with a sad affect and depressed behavior. Pt supported emotionally and encouraged to express concerns and questions. Pt's safety ensured with 15 minute and environmental checks. Pt currently denies SI/HI and A/V hallucinations. Pt verbally agrees to seek staff if SI/HI or A/VH occurs and to consult with staff before acting on any harmful thoughts. Pt requests sleep medications, see MAR. Pt requests to be put back on her Seroquel and Paxil tomorrow. Will continue POC.

## 2016-09-23 NOTE — ED Notes (Signed)
This nurse [facility] was told will contact unit when bed is ready

## 2016-09-23 NOTE — Progress Notes (Signed)
09/23/16 1400:  LRT introduced self to pt and offered activities.  Pt declined.  Caroll RancherMarjette Balthazar Dooly, LRT/CTRS

## 2016-09-23 NOTE — ED Notes (Signed)
Orders received to discharge patient. Pt aware and discharge summery reviewed with patient as well as review of medications. All personal items return and patient signed all discharge paperwork. Pt escorted off unit.  

## 2016-09-23 NOTE — ED Notes (Signed)
[facility].  Will call back again after shift change.

## 2016-09-23 NOTE — BH Assessment (Signed)
[facility] Assessment Progress Note  Per Thedore MinsMojeed Akintayo, MD, this pt requires psychiatric hospitalization at this time.  Karen Heinrichina Tate, RN, [facility]  Pt has signed Voluntary Admission and Consent for Treatment, as well as Consent to Release Information to her PCP and her behavioral health provider in [place], and signed forms [facility].  Pt's nurse, Morrie Sheldonshley, has been notified, and agrees to send original paperwork along with pt via [facility], and to call report to [phone].  Karen Canninghomas Tramel Westbrook, MA Triage Specialist [phone]

## 2016-09-23 NOTE — ED Provider Notes (Signed)
Assumed care from Dr. Fredderick PhenixBelfi. Patient initially presented for intentional overdose in a suicide attempt. Patient is here voluntarily. Patient initially was sedated and mildly hypotensive secondary to the ingestion. She has been monitored for an extended period of time and given IV fluid hydration. Vital signs are improved and patient is now medically clear for psychiatric evaluation.  Vitals:   09/23/16 0318 09/23/16 0330  BP: 111/81 110/78  Pulse:  84  Resp:  17  Temp:        Gilda Creasehristopher J Pollina, MD 09/23/16 [phone]

## 2016-09-24 DIAGNOSIS — F1020 Alcohol dependence, uncomplicated: Secondary | ICD-10-CM

## 2016-09-24 DIAGNOSIS — Z888 Allergy status to other drugs, medicaments and biological substances status: Secondary | ICD-10-CM

## 2016-09-24 DIAGNOSIS — Z91011 Allergy to milk products: Secondary | ICD-10-CM

## 2016-09-24 MED ORDER — THIAMINE HCL 100 MG/ML IJ SOLN
100.0000 mg | Freq: Once | INTRAMUSCULAR | Status: AC
  Administered 2016-09-24: 100 mg via INTRAMUSCULAR
  Filled 2016-09-24: qty 2

## 2016-09-24 MED ORDER — ONDANSETRON 4 MG PO TBDP
4.0000 mg | ORAL_TABLET | Freq: Four times a day (QID) | ORAL | Status: DC | PRN
  Administered 2016-09-24 – 2016-09-26 (×3): 4 mg via ORAL
  Filled 2016-09-24 (×3): qty 1

## 2016-09-24 MED ORDER — ARIPIPRAZOLE 5 MG PO TABS
5.0000 mg | ORAL_TABLET | Freq: Every day | ORAL | Status: DC
  Administered 2016-09-25 – 2016-09-30 (×6): 5 mg via ORAL
  Filled 2016-09-24 (×4): qty 1
  Filled 2016-09-24: qty 14
  Filled 2016-09-24 (×3): qty 1

## 2016-09-24 MED ORDER — DM-GUAIFENESIN ER 30-600 MG PO TB12
1.0000 | ORAL_TABLET | Freq: Two times a day (BID) | ORAL | Status: AC
Start: 2016-09-24 — End: 2016-09-29
  Administered 2016-09-24 – 2016-09-29 (×10): 1 via ORAL
  Filled 2016-09-24 (×10): qty 1

## 2016-09-24 MED ORDER — ARIPIPRAZOLE 10 MG PO TABS
10.0000 mg | ORAL_TABLET | Freq: Every day | ORAL | Status: DC
  Administered 2016-09-24: 10 mg via ORAL
  Filled 2016-09-24 (×3): qty 1

## 2016-09-24 MED ORDER — ADULT MULTIVITAMIN W/MINERALS CH
1.0000 | ORAL_TABLET | Freq: Every day | ORAL | Status: DC
  Administered 2016-09-24 – 2016-09-30 (×7): 1 via ORAL
  Filled 2016-09-24 (×9): qty 1

## 2016-09-24 MED ORDER — TRAZODONE HCL 50 MG PO TABS
50.0000 mg | ORAL_TABLET | Freq: Every evening | ORAL | Status: DC | PRN
  Administered 2016-09-24 – 2016-09-26 (×3): 50 mg via ORAL
  Filled 2016-09-24 (×2): qty 1

## 2016-09-24 MED ORDER — CHLORDIAZEPOXIDE HCL 25 MG PO CAPS
25.0000 mg | ORAL_CAPSULE | ORAL | Status: AC
  Administered 2016-09-26 (×2): 25 mg via ORAL
  Filled 2016-09-24 (×2): qty 1

## 2016-09-24 MED ORDER — CHLORDIAZEPOXIDE HCL 25 MG PO CAPS
25.0000 mg | ORAL_CAPSULE | Freq: Every day | ORAL | Status: AC
Start: 2016-09-27 — End: 2016-09-27
  Administered 2016-09-27: 25 mg via ORAL
  Filled 2016-09-24: qty 1

## 2016-09-24 MED ORDER — LAMOTRIGINE 25 MG PO TABS
50.0000 mg | ORAL_TABLET | Freq: Every day | ORAL | Status: DC
  Administered 2016-09-24: 50 mg via ORAL
  Filled 2016-09-24 (×3): qty 2

## 2016-09-24 MED ORDER — CHLORDIAZEPOXIDE HCL 25 MG PO CAPS
25.0000 mg | ORAL_CAPSULE | Freq: Three times a day (TID) | ORAL | Status: AC
  Administered 2016-09-25 (×3): 25 mg via ORAL
  Filled 2016-09-24 (×3): qty 1

## 2016-09-24 MED ORDER — CHLORDIAZEPOXIDE HCL 25 MG PO CAPS
25.0000 mg | ORAL_CAPSULE | Freq: Four times a day (QID) | ORAL | Status: AC
  Administered 2016-09-24 (×3): 25 mg via ORAL
  Filled 2016-09-24 (×3): qty 1

## 2016-09-24 MED ORDER — CHLORDIAZEPOXIDE HCL 25 MG PO CAPS: 25.0000 mg | ORAL_CAPSULE | Freq: Four times a day (QID) | ORAL | Status: AC | PRN

## 2016-09-24 MED ORDER — PNEUMOCOCCAL VAC POLYVALENT 25 MCG/0.5ML IJ INJ
0.5000 mL | INJECTION | INTRAMUSCULAR | Status: AC
  Administered 2016-09-25: 0.5 mL via INTRAMUSCULAR

## 2016-09-24 MED ORDER — HYDROXYZINE HCL 25 MG PO TABS
25.0000 mg | ORAL_TABLET | Freq: Four times a day (QID) | ORAL | Status: AC | PRN
  Administered 2016-09-24 – 2016-09-26 (×3): 25 mg via ORAL
  Filled 2016-09-24 (×3): qty 1

## 2016-09-24 MED ORDER — FLUTICASONE PROPIONATE 50 MCG/ACT NA SUSP
1.0000 | Freq: Every day | NASAL | Status: DC
  Administered 2016-09-24 – 2016-09-29 (×6): 1 via NASAL
  Filled 2016-09-24 (×2): qty 16

## 2016-09-24 MED ORDER — LAMOTRIGINE 25 MG PO TABS
25.0000 mg | ORAL_TABLET | Freq: Every day | ORAL | Status: DC
  Administered 2016-09-25 – 2016-09-30 (×6): 25 mg via ORAL
  Filled 2016-09-24 (×6): qty 1
  Filled 2016-09-24: qty 14
  Filled 2016-09-24 (×2): qty 1

## 2016-09-24 MED ORDER — VITAMIN B-1 100 MG PO TABS
100.0000 mg | ORAL_TABLET | Freq: Every day | ORAL | Status: DC
  Administered 2016-09-25 – 2016-09-30 (×6): 100 mg via ORAL
  Filled 2016-09-24 (×8): qty 1

## 2016-09-24 MED ORDER — TRAZODONE HCL 150 MG PO TABS
150.0000 mg | ORAL_TABLET | Freq: Every day | ORAL | Status: DC
  Filled 2016-09-24 (×2): qty 1

## 2016-09-24 MED ORDER — LOPERAMIDE HCL 2 MG PO CAPS: 2.0000 mg | ORAL_CAPSULE | ORAL | Status: AC | PRN

## 2016-09-24 NOTE — Progress Notes (Signed)
The patient attended the evening A.A. Meeting and was appropriate.  

## 2016-09-24 NOTE — Progress Notes (Signed)
Karen Yates is a 31 year old female being admitted voluntarily to [facility] from [facility].  She presented to the ED after ingesting 15 gabapentin and 8 Seroquel tabs in a suicide attempt.  She reported that she has a diagnoses of Bipolar Disorder and depression has been increasing.  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  She reported multiple stressors; separated from her husband, unable to see her children and possible eviction.  She has a history of multiple psychiatric hospitalizations and suicide attempts in the past.  She has a medical history of IBS and thyroid problems.  She denies any pain or discomfort and appears to be in no physical distress.  She denies SI/HI or A/V hallucinations.  She does report that her suicidal thoughts come and go and she will contract for safety on the unit.  Oriented her to the unit.  Admission paperwork completed and signed.  Belongings searched and secured in locker # 33.  Skin assessment completed and noted C-section scar and multiple tattoos.  Q 15 minute checks initiated for safety.  We will monitor the progress towards his goals.

## 2016-09-24 NOTE — BHH Counselor (Signed)
Adult Comprehensive Assessment  Patient ID: Karen Yates, female   DOB: [birth year], 31 y.o.   MRN: [id number]  Information Source: Information source: Patient  Current Stressors:  Educational / Learning stressors: n/a Employment / Job issues: pt too anxious to hold a job Family Relationships: relationship with mom is very stressful, two sons just moved to [place] with ex husband with little Water engineernotice Financial / Lack of resources (include bankruptcy): yes, pt sts she impulsively spends, [employer]can't budget Housing / Lack of housing: n/a Physical health (include injuries & life threatening diseases): n/a Social relationships: pt reports no social relationships  Substance abuse: daily drinking Bereavement / Loss: loss of two kids to [place]  Living/Environment/Situation:  Living Arrangements: Spouse/significant other, Children (husband, 10 mo daughter) Living conditions (as described by patient or guardian): good How long has patient lived in current situation?: two yrs What is atmosphere in current home: Chaotic, Supportive  Family History:  Marital status: Married What types of issues is patient dealing with in the relationship?: pt's impulsive spending Additional relationship information: pt reports husband is supportive and a friend to her, pt's ex husband and pt's current husband are brothers Are you sexually active?: Yes Does patient have children?: Yes How many children?: 3 How is patient's relationship with their children?: 11 mo daughter lives w/ pt, 755 yo and 287 yo sons just moved to [place] with ex husband  Childhood History:    Education:  Highest grade of school patient has completed: some college Currently a Consulting civil engineerstudent?: No Learning disability?: No  Employment/Work Situation:  Employment situation: Unemployed Patient's job has been impacted by current illness: Yes Describe how patient's job has been impacted: pt sts too anxious to walk into environment with a lot of people she  doesn't know 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 Has patient ever been in the [employer] and Companymilitary?: No Has patient ever served in combat?: No Did You Receive Any Psychiatric Treatment/Services While in Equities traderthe Military?: No Are There Guns or Other Weapons in Your Home?: No  Financial Resources:  Financial resources: Income from spouse (food stamps cut off, just reapplied) Does patient have a representative payee or guardian?: No  Alcohol/Substance Abuse:  What has been your use of drugs/alcohol within the last 12 months?: drinks beer or wine daily If attempted suicide, did drugs/alcohol play a role in this?: No Alcohol/Substance Abuse Treatment Hx: Past Tx, Inpatient If yes, describe treatment: 2013 [facility] Has alcohol/substance abuse ever caused legal problems?: No  Social Support System: Conservation officer, natureatient's Community Support System: None Describe Community Support System: n/a Type of faith/religion: pt spiritual but doesn't attend organized religion. She feels most spiritual in woods. Would go to church but she is too anxious to walk into a church service How does patient's faith help to cope with current illness?: n/a  Leisure/Recreation:  Leisure and Hobbies: cooks  Strengths/Needs:  What things does the patient do well?: sings, cooks, being in the woods In what areas does patient struggle / problems for patient: math, talking on phone, new social situations  Discharge Plan:  Does patient have access to transportation?: Yes (pt sts a relative would pick her up if husband can't) Will patient be returning to same living situation after discharge?: Yes Currently receiving community mental health services: No If no, would patient like referral for services when discharged?: Yes (What county?) (would like referral for [facility] and if that is not an option, DBT treatment) Does patient have  financial barriers related to discharge medications?:  Yes Patient description of barriers related to discharge medications: pt sts has medicaid but sometimes can't afford the $3 copay   Summary/Recommendations:   Summary and Recommendations (to be completed by the evaluator): Patient is 31 yo female living in [place], [place] She presents to the hospital seeking treatment for SI with attempted Overdose prior to admission. She reports increased alcohol consumption over the past 3 months. Stressors include: separation from husband, unable to see her 1yo daughter, and recent eviction. Patient currently denies SI/HI/AVH. She is interested in inpatient treatment for substance abuse treatment. Recommendations for patient include: crisis stabilization, therapeutic milieu, encourage group attendance and participation, medication management/mood stabilization, and development of comprehensive mental wellness/sobriety plan.   Ledell Peoples Smart LCSW  09/24/2016 12:33 PM

## 2016-09-24 NOTE — Progress Notes (Signed)
D: Patient continues to endorse depression and anxiety.  She has passive suicidal thoughts and is able to contract for safety.  Patient has required some prns for withdrawal symptoms.  She reports diarrhea, craving and runny nose.  She is complaining of some cold symptoms.  Patient rates her depression, anxiety and hopelessness as a 9.  Her goal today is to "have positive thoughts."  Patient was started on librium protocol and is tolerating well.  Her affect is flat; mood is depressed and sad. A: Continue to monitor medication management and MD orders.  Safety checks completed every 15 minutes per protocol.  Offer support and encouragement as needed. R: Patient is receptive to staff; her behavior is appropriate.
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
## 2016-09-24 NOTE — [facility] Group Notes (Signed)
[facility] LCSW Group Therapy  09/24/2016 1:15 PM  Type of Therapy:  Group Therapy  Participation Level:  Did Not Attend-pt invited. Chose to rest in room.   Summary of Progress/Problems: [facility] Speaker came to talk about his personal journey with substance abuse and addiction. The pt processed ways by which to relate to the speaker. MHA speaker provided handouts and educational information pertaining to groups and services [facility].   Shelita Steptoe N Smart LCSW 09/24/2016, 1:15 PM

## 2016-09-24 NOTE — Tx Team (Signed)
Interdisciplinary Treatment and Diagnostic Plan Update  09/24/2016 Time of Session: 9:30AM Karen Yates B Aggarwal MRN: [id number]  Principal Diagnosis: MDD (major depressive disorder), recurrent severe, without psychosis (HCC)  Secondary Diagnoses: Principal Problem:   MDD (major depressive disorder), recurrent severe, without psychosis (HCC)   Current Medications:  Current Facility-Administered Medications  Medication Dose Route Frequency Provider Last Rate Last Dose  . acetaminophen (TYLENOL) tablet 650 mg  650 mg Oral Q4H PRN Charm RingsJamison Y Lord, NP      . alum & mag hydroxide-simeth (MAALOX/MYLANTA) 200-200-20 MG/5ML suspension 30 mL  30 mL Oral PRN Charm RingsJamison Y Lord, NP      . Melene Muller[START ON 09/25/2016] ARIPiprazole (ABILIFY) tablet 5 mg  5 mg Oral Daily Rockey SituFernando A Cobos, MD      . chlordiazePOXIDE (LIBRIUM) capsule 25 mg  25 mg Oral Q6H PRN Sanjuana KavaAgnes I Nwoko, NP      . chlordiazePOXIDE (LIBRIUM) capsule 25 mg  25 mg Oral QID Sanjuana KavaAgnes I Nwoko, NP   25 mg at 09/24/16 1148   Followed by  . [START ON 09/25/2016] chlordiazePOXIDE (LIBRIUM) capsule 25 mg  25 mg Oral TID Sanjuana KavaAgnes I Nwoko, NP       Followed by  . [START ON 09/26/2016] chlordiazePOXIDE (LIBRIUM) capsule 25 mg  25 mg Oral BH-qamhs Sanjuana KavaAgnes I Nwoko, NP       Followed by  . [START ON 09/27/2016] chlordiazePOXIDE (LIBRIUM) capsule 25 mg  25 mg Oral Daily Sanjuana KavaAgnes I Nwoko, NP      . dextromethorphan-guaiFENesin (MUCINEX DM) 30-600 MG per 12 hr tablet 1 tablet  1 tablet Oral BID Sanjuana KavaAgnes I Nwoko, NP      . fluticasone (FLONASE) 50 MCG/ACT nasal spray 1 spray  1 spray Each Nare Daily Sanjuana KavaAgnes I Nwoko, NP   1 spray at 09/24/16 1145  . hydrOXYzine (ATARAX/VISTARIL) tablet 25 mg  25 mg Oral Q6H PRN Sanjuana KavaAgnes I Nwoko, NP      . ibuprofen (ADVIL,MOTRIN) tablet 600 mg  600 mg Oral Q8H PRN Charm RingsJamison Y Lord, NP   600 mg at 09/24/16 1428  . [START ON 09/25/2016] lamoTRIgine (LAMICTAL) tablet 25 mg  25 mg Oral Daily Rockey SituFernando A Cobos, MD      . levothyroxine (SYNTHROID, LEVOTHROID) tablet 88  mcg  88 mcg Oral QAC breakfast Charm RingsJamison Y Lord, NP   88 mcg at 09/24/16 0655  . loperamide (IMODIUM) capsule 2-4 mg  2-4 mg Oral PRN Sanjuana KavaAgnes I Nwoko, NP      . magnesium hydroxide (MILK OF MAGNESIA) suspension 30 mL  30 mL Oral Daily PRN Charm RingsJamison Y Lord, NP      . multivitamin with minerals tablet 1 tablet  1 tablet Oral Daily Sanjuana KavaAgnes I Nwoko, NP   1 tablet at 09/24/16 1147  . nicotine (NICODERM CQ - dosed in mg/24 hours) patch 21 mg  21 mg Transdermal Daily Craige CottaFernando A Cobos, MD   21 mg at 09/24/16 0828  . ondansetron (ZOFRAN) tablet 4 mg  4 mg Oral Q8H PRN Charm RingsJamison Y Lord, NP      . ondansetron (ZOFRAN-ODT) disintegrating tablet 4 mg  4 mg Oral Q6H PRN Sanjuana KavaAgnes I Nwoko, NP   4 mg at 09/24/16 1207  . PARoxetine (PAXIL) tablet 10 mg  10 mg Oral Daily Charm RingsJamison Y Lord, NP   10 mg at 09/24/16 0803  . [START ON 09/25/2016] pneumococcal 23 valent vaccine (PNU-IMMUNE) injection 0.5 mL  0.5 mL Intramuscular Tomorrow-1000 Craige CottaFernando A Cobos, MD      . thiamine (B-1)  injection 100 mg  100 mg Intravenous Daily Charm Rings, NP      . Melene Muller ON 09/25/2016] thiamine (VITAMIN B-1) tablet 100 mg  100 mg Oral Daily Sanjuana Kava, NP      . traZODone (DESYREL) tablet 50 mg  50 mg Oral QHS PRN Craige Cotta, MD       PTA Medications: Prescriptions Prior to Admission  Medication Sig Dispense Refill Last Dose  . ARIPiprazole (ABILIFY) 10 MG tablet Take 1 tablet (10 mg total) by mouth daily. (Patient not taking: Reported on 09/22/2016) 30 tablet 0 Not Taking at Unknown time  . DULoxetine (CYMBALTA) 20 MG capsule Take 1 capsule (20 mg total) by mouth daily. (Patient not taking: Reported on 09/22/2016) 30 capsule 0 Not Taking at Unknown time  . gabapentin (NEURONTIN) 300 MG capsule Take 300 mg by mouth 3 (three) times daily.   09/22/2016 at Unknown time  . lamoTRIgine (LAMICTAL) 25 MG tablet Take 2 tablets (50 mg total) by mouth daily. (Patient not taking: Reported on 09/22/2016) 60 tablet 0 Not Taking at Unknown time  . levothyroxine  (SYNTHROID, LEVOTHROID) 75 MCG tablet Take 1 tablet (75 mcg total) by mouth daily before breakfast. (Patient not taking: Reported on 09/22/2016) 30 tablet 0 Not Taking at Unknown time  . levothyroxine (SYNTHROID, LEVOTHROID) 88 MCG tablet Take 88 mcg by mouth daily before breakfast.   09/22/2016 at Unknown time  . LORazepam (ATIVAN) 1 MG tablet Take 1 mg by mouth 3 (three) times daily as needed for anxiety.   a week at Unknown time  . nicotine (NICODERM CQ - DOSED IN MG/24 HOURS) 14 mg/24hr patch Place 1 patch (14 mg total) onto the skin daily. (Patient not taking: Reported on 09/22/2016) 28 patch 0 Not Taking at Unknown time  . PARoxetine (PAXIL) 10 MG tablet Take 10 mg by mouth daily.   09/22/2016 at Unknown time  . prazosin (MINIPRESS) 1 MG capsule Take 1 capsule (1 mg total) by mouth at bedtime. (Patient not taking: Reported on 09/22/2016) 30 capsule 0 Not Taking at Unknown time  . QUEtiapine (SEROQUEL) 100 MG tablet Take 100-150 mg by mouth 3 (three) times daily. Takes 100 mg twice daily and then 150 mg at night.   09/22/2016 at Unknown time  . saccharomyces boulardii (FLORASTOR) 250 MG capsule Take 1 capsule (250 mg total) by mouth 2 (two) times daily. (Patient not taking: Reported on 09/22/2016) 60 capsule 0 Not Taking at Unknown time  . [DISCONTINUED] mirtazapine (REMERON) 15 MG tablet Take 1 tablet (15 mg total) by mouth at bedtime. (Patient not taking: Reported on 09/22/2016) 30 tablet 0 Not Taking at Unknown time    Patient Stressors: Financial difficulties Marital or family conflict Substance abuse  Patient Strengths: Wellsite geologist fund of knowledge Motivation for treatment/growth Physical Health  Treatment Modalities: Medication Management, Group therapy, Case management,  1 to 1 session with clinician, Psychoeducation, Recreational therapy.   Physician Treatment Plan for Primary Diagnosis: MDD (major depressive disorder), recurrent severe, without psychosis (HCC) Long Term  Goal(s):     Short Term Goals:    Medication Management: Evaluate patient's response, side effects, and tolerance of medication regimen.  Therapeutic Interventions: 1 to 1 sessions, Unit Group sessions and Medication administration.  Evaluation of Outcomes: Progressing  Physician Treatment Plan for Secondary Diagnosis: Principal Problem:   MDD (major depressive disorder), recurrent severe, without psychosis (HCC)  Long Term Goal(s):     Short Term Goals:  Medication Management: Evaluate patient's response, side effects, and tolerance of medication regimen.  Therapeutic Interventions: 1 to 1 sessions, Unit Group sessions and Medication administration.  Evaluation of Outcomes: Progressing   RN Treatment Plan for Primary Diagnosis: MDD (major depressive disorder), recurrent severe, without psychosis (HCC) Long Term Goal(s): Knowledge of disease and therapeutic regimen to maintain health will improve  Short Term Goals: Ability to remain free from injury will improve, Ability to verbalize feelings will improve and Ability to disclose and discuss suicidal ideas  Medication Management: RN will administer medications as ordered by provider, will assess and evaluate patient's response and provide education to patient for prescribed medication. RN will report any adverse and/or side effects to prescribing provider.  Therapeutic Interventions: 1 on 1 counseling sessions, Psychoeducation, Medication administration, Evaluate responses to treatment, Monitor vital signs and CBGs as ordered, Perform/monitor CIWA, COWS, AIMS and Fall Risk screenings as ordered, Perform wound care treatments as ordered.  Evaluation of Outcomes: Progressing   LCSW Treatment Plan for Primary Diagnosis: MDD (major depressive disorder), recurrent severe, without psychosis (HCC) Long Term Goal(s): Safe transition to appropriate next level of care at discharge, Engage patient in therapeutic group addressing  interpersonal concerns.  Short Term Goals: Engage patient in aftercare planning with referrals and resources, Facilitate patient progression through stages of change regarding substance use diagnoses and concerns and Identify triggers associated with mental health/substance abuse issues  Therapeutic Interventions: Assess for all discharge needs, 1 to 1 time with Social worker, Explore available resources and support systems, Assess for adequacy in community support network, Educate family and significant other(s) on suicide prevention, Complete Psychosocial Assessment, Interpersonal group therapy.  Evaluation of Outcomes: Progressing   Progress in Treatment: Attending groups: No. Participating in groups: No.New to unit. Continuing to assess.  Taking medication as prescribed: Yes. Toleration medication: Yes. Family/Significant other contact made: No, will contact:  family member if patient consents Patient understands diagnosis: Yes. Discussing patient identified problems/goals with staff: Yes. Medical problems stabilized or resolved: Yes. Denies suicidal/homicidal ideation: No. Passive SI/Able to contract for safety on the unit. Recent attempted OD prior to admission. Issues/concerns per patient self-inventory: No. Other: n/a  New problem(s) identified: No, Describe:  n/a  New Short Term/Long Term Goal(s): medication stabilization; detox; development of comprehensive mental wellness/sobriety plan.   Discharge Plan or Barriers: CSW assessing for appropriate referrals. Last admitted Sept 2017 and Aug 2017. She was referred to Psych IOP with Jeri Modena during that time.   Reason for Continuation of Hospitalization: Depression Medication stabilization Suicidal ideation Withdrawal symptoms  Estimated Length of Stay: 3-5 days   Attendees: Patient: 09/24/2016 2:54 PM  Physician: Dr. Elna Breslow MD; Dr. Jama Flavors MD 09/24/2016 2:54 PM  Nursing: Yates Decamp RN 09/24/2016 2:54 PM  RN Care Manager:  Onnie Boer CM 09/24/2016 2:54 PM  Social Worker: Herbert Seta Smart, LCSW; Vernie Shanks LCSW 09/24/2016 2:54 PM  Recreational Therapist:  09/24/2016 2:54 PM  Other: Gray Bernhardt NP ; Armandina Stammer NP 09/24/2016 2:54 PM  Other:  09/24/2016 2:54 PM  Other: 09/24/2016 2:54 PM    Scribe for Treatment Team: Ledell Peoples Smart, LCSW 09/24/2016 2:54 PM

## 2016-09-24 NOTE — Tx Team (Signed)
Initial Treatment Plan 09/24/2016 12:51 AM Karen SladeAmanda B Yates RUE:[id number]    PATIENT STRESSORS: Financial difficulties Marital or family conflict Substance abuse   PATIENT STRENGTHS: Wellsite geologistCommunication skills General fund of knowledge Motivation for treatment/growth Physical Health   PATIENT IDENTIFIED PROBLEMS: Depression  Anxiety  Suicidal ideation  "Get my sobriety back"  "Try not to be so              DISCHARGE CRITERIA:  Improved stabilization in mood, thinking, and/or behavior Verbal commitment to aftercare and medication compliance Withdrawal symptoms are absent or subacute and managed without 24-hour nursing intervention  PRELIMINARY DISCHARGE PLAN: Outpatient therapy Medication management  PATIENT/FAMILY INVOLVEMENT: This treatment plan has been presented to and reviewed with the patient, Karen Yates.  The patient and family have been given the opportunity to ask questions and make suggestions.  Levin BaconHeather V Shiri Hodapp, RN 09/24/2016, 12:51 AM
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
## 2016-09-24 NOTE — [facility] Group Notes (Signed)
[facility] Group Notes:  (Nursing/MHT/Case Management/Adjunct)  Date:  09/24/2016  Time:  9:25 AM  Type of Therapy:  Psychoeducational Skills  Participation Level:  Active  Participation Quality:  Appropriate  Affect:  Appropriate and Flat  Cognitive:  Alert and Appropriate  Insight:  Appropriate  Engagement in Group:  Developing/Improving  Modes of Intervention:  Support  Summary of Progress/Problems: Patient had a difficult time talking to staff due to distractions from her peers.  Many side conversations were going on while patient was attempting to talk.    Cranford MonBeaudry, Danny Zimny Evans 09/24/2016, 9:25 AM

## 2016-09-24 NOTE — Progress Notes (Signed)
Recreation Therapy Notes  Animal-Assisted Activity (AAA) Program Checklist/Progress Notes Patient Eligibility Criteria Checklist & Daily Group note for Rec TxIntervention  Date: 01.09.2018 Time: 2:45pm Location: 400 [address] Dayroom    AAA/T Program Assumption of Risk Form signed by Patient/ or Parent Legal Guardian Yes  Patient is free of allergies or sever asthma Yes  Patient reports no fear of animals Yes  Patient reports no history of cruelty to animals Yes  Patient understands his/her participation is voluntary Yes  Behavioral Response: Did not attend.   Kendyll Huettner L Jalani Rominger, LRT/CTRS        Dandria Griego L 09/24/2016 3:09 PM 

## 2016-09-25 LAB — BASIC METABOLIC PANEL
Anion gap: 9 (ref 5–15)
BUN: 14 mg/dL (ref 6–20)
CHLORIDE: 104 mmol/L (ref 101–111)
CO2: 24 mmol/L (ref 22–32)
CREATININE: 0.75 mg/dL (ref 0.44–1.00)
Calcium: 9.3 mg/dL (ref 8.9–10.3)
GFR calc Af Amer: >60 mL/min (ref >60)
GFR calc non Af Amer: >60 mL/min (ref >60)
Glucose, Bld: 133 mg/dL — ABNORMAL HIGH (ref 65–99)
Potassium: 3.7 mmol/L (ref 3.5–5.1)
SODIUM: 137 mmol/L (ref 135–145)

## 2016-09-25 NOTE — Progress Notes (Signed)
Recreation Therapy Notes  Date: 09/25/16 Time: 0930 Location: [address] Group Room  Group Topic: Stress Management  Goal Area(s) Addresses:  Patient will verbalize importance of using healthy stress management.  Patient will identify positive emotions associated with healthy stress management.   Intervention: Stress Management  Activity :  Letting Go Meditation.  LRT introduced the stress management technique of meditation.  LRT played a meditation from the Calm app so patients could engage in the activity.  Patients were to follow along with the meditation to participate.  Education:  Stress Management, Discharge Planning.   Education Outcome: Acknowledges edcuation/In group clarification offered/Needs additional education  Clinical Observations/Feedback: Pt did not attend group.   Caroll RancherMarjette Eulanda Dorion, LRT/CTRS         Caroll RancherLindsay, Kaeden Depaz A 09/25/2016 12:39 PM

## 2016-09-25 NOTE — Progress Notes (Signed)
Nursing Progress Note 7p-7a  D) Patient presents pleasant and cooperative. Patient denies SI/HI/AVH or pain. Patient complains of mild anxiety. Patient contracts for safety at this time.   A) Emotional support given. Patient medicated with PM orders as prescribed. Medications reviewed with patient. Patient on q15 min safety checks. Opportunities for questions or concerns presented to patient. Patient encouraged to continue to work on treatment goals.  R) Patient receptive to interaction with nurse. Patient remains safe on the unit at this time. Patient is resting in bed without complaints. Will continue to monitor.

## 2016-09-25 NOTE — Progress Notes (Signed)
D: Patient has been less needy today.  Patient rates her depression as an 8; hopelessness as a 7 and anxiety as a 9.  She has passive SI, however, she contracts for safety on the unit.  Patient's goal today is to "attend all groups."  Patient is sleeping and eating well; her concentration is poor and energy normal.  She reports withdrawal symptoms as diarrhea, cravings, runny nose, chilling and nausea.  Patient has been visible in the milieu and is interacting well with her peers and staff. A: Continue to monitor medication management and MD orders.  Safety checks completed every 15 minutes per protocol.  Offer support and encouragement as needed. R: Patient is receptive to staff; her behavior is appropriate.

## 2016-09-25 NOTE — Progress Notes (Signed)
Adult Psychoeducational Group Note  Date:  09/25/2016 Time:  11:33 AM  Group Topic/Focus:  Personal Choices and Values:   The focus of this group is to help patients assess and explore the importance of values in their lives, how their values affect their decisions, how they express their values and what opposes their expression.   Participation Level:  Active  Participation Quality:  Appropriate  Affect:  Appropriate  Cognitive:  Appropriate  Insight: Good  Engagement in Group:  Improving  Modes of Intervention:  Orientation  Additional Comments:  Pt. Discussed working on her issues while her children are in a safe place in order to be able to care for them once she is clean.  Karen BeersRodney S Brenly Yates 09/25/2016, 11:33 AM

## 2016-09-25 NOTE — [facility] Group Notes (Signed)
[facility] LCSW Group Therapy  09/25/2016 3:15 PM  Type of Therapy:  Group Therapy  Participation Level:  Did Not Attend-pt invited. Chose to rest in room.   Summary of Progress/Problems: Today's Topic: Overcoming Obstacles. Patients identified one short term goal and potential obstacles in reaching this goal. Patients processed barriers involved in overcoming these obstacles. Patients identified steps necessary for overcoming these obstacles and explored motivation (internal and external) for facing these difficulties head on.   Jann Ra N Smart LCSW 09/25/2016, 3:15 PM

## 2016-09-25 NOTE — Progress Notes (Signed)
Patient ID: Karen Yates, female   DOB: [birth year], 31 y.o.   MRN: [id number]  Pt currently presents with a flat affect and depressed behavior. Pt interacts positively with peers. Pt reports good sleep with current medication regimen. Pt complains of depression and anxiety today. Pt mood is depressed. Complains of dry eyes.  Pt provided with medications per providers orders. Pt's labs and vitals were monitored throughout the night. Pt supported emotionally and encouraged to express concerns and questions. Pt educated on medications. Pt encouraged to speak with MD about dry eyes.   Pt's safety ensured with 15 minute and environmental checks. Pt currently denies SI/HI and A/V hallucinations. Pt verbally agrees to seek staff if SI/HI or A/VH occurs and to consult with staff before acting on any harmful thoughts. Will continue POC.
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
## 2016-09-25 NOTE — Plan of Care (Signed)
Problem: Safety: Goal: Periods of time without injury will increase Outcome: Progressing Patient remains safe on the unit at this time. Patient contracts for safety and is on q15 minute safety checks. Patient is a low falls risk; fall safety reviewed with patient. Patient able to express needs. Vital signs stable.     

## 2016-09-26 LAB — RESPIRATORY PANEL BY PCR
Adenovirus: NOT DETECTED
BORDETELLA PERTUSSIS-RVPCR: NOT DETECTED
CORONAVIRUS HKU1-RVPPCR: NOT DETECTED
CORONAVIRUS OC43-RVPPCR: NOT DETECTED
Chlamydophila pneumoniae: NOT DETECTED
Coronavirus 229E: NOT DETECTED
Coronavirus NL63: NOT DETECTED
Influenza A: NOT DETECTED
Influenza B: NOT DETECTED
METAPNEUMOVIRUS-RVPPCR: NOT DETECTED
Mycoplasma pneumoniae: NOT DETECTED
PARAINFLUENZA VIRUS 1-RVPPCR: NOT DETECTED
PARAINFLUENZA VIRUS 2-RVPPCR: NOT DETECTED
Parainfluenza Virus 3: NOT DETECTED
Parainfluenza Virus 4: NOT DETECTED
RESPIRATORY SYNCYTIAL VIRUS-RVPPCR: NOT DETECTED
RHINOVIRUS / ENTEROVIRUS - RVPPCR: NOT DETECTED

## 2016-09-26 MED ORDER — TETRAHYDROZOLINE HCL 0.05 % OP SOLN
1.0000 [drp] | Freq: Four times a day (QID) | OPHTHALMIC | Status: DC | PRN
  Administered 2016-09-26 – 2016-09-27 (×3): 2 [drp] via OPHTHALMIC
  Administered 2016-09-27 (×2): 1 [drp] via OPHTHALMIC
  Administered 2016-09-28 – 2016-09-30 (×3): 2 [drp] via OPHTHALMIC
  Filled 2016-09-26: qty 15
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
## 2016-09-26 NOTE — Progress Notes (Signed)
D: Patient is anxious today and attributes that to her elevated blood pressure.  Patient is to start on clonidine at 1700 for same.  Her blood pressure has remained elevated and patient is taking lisinopril 10 mg.  Patient requested medication for anxiety and vistaril prn was ordered.  Patient didn't understand why her neurontin was discontinued when she had been taking it regularly.  Her neurontin was restarted.  Patient reports minimal withdrawal symptoms and she has completed the ativan protocol.  She rates her depression as a 7; hopelessness as a 6; anxiety as a 10.  She states, "I got the best sleep last night."  She appears well rested and her mood is stable.  Even though patient is anxious, her affect is brighter.  She is interacting well with staff and her peers. A: Continue to monitor medication management and MD orders.  Safety checks completed every 15 minutes per protocol.  Offer support and encouragement as needed. R: Patient is receptive to staff; her behavior is appropriate.

## 2016-09-26 NOTE — Progress Notes (Signed)
Adult Psychoeducational Group Note  Date:  09/26/2016 Time:  2100 Group Topic/Focus:  wrap up group  Participation Level:  Active  Participation Quality:  Appropriate, Attentive, Sharing and Supportive  Affect:  Flat  Cognitive:  Appropriate  Insight: Improving  Engagement in Group:  Engaged  Modes of Intervention:  Clarification, Education and Support  Additional Comments:    Karen Yates, Karen Yates S 09/26/2016, 11:25 PM

## 2016-09-26 NOTE — [facility] Group Notes (Signed)
[facility] LCSW Group Therapy  09/26/2016 1:44 PM  Type of Therapy:  Group Therapy  Participation Level:  Did Not Attend-pt invited. Chose to remain in bed.   Summary of Progress/Problems: Emotion Regulation: This group focused on both positive and negative emotion identification and allowed group members to process ways to identify feelings, regulate negative emotions, and find healthy ways to manage internal/external emotions. Group members were asked to reflect on a time when their reaction to an emotion led to a negative outcome and explored how alternative responses using emotion regulation would have benefited them. Group members were also asked to discuss a time when emotion regulation was utilized when a negative emotion was experienced.   Santez Woodcox N Smart LCSW 09/26/2016, 1:44 PM

## 2016-09-27 DIAGNOSIS — Z9889 Other specified postprocedural states: Secondary | ICD-10-CM

## 2016-09-27 DIAGNOSIS — Z818 Family history of other mental and behavioral disorders: Secondary | ICD-10-CM

## 2016-09-27 DIAGNOSIS — F332 Major depressive disorder, recurrent severe without psychotic features: Principal | ICD-10-CM

## 2016-09-27 DIAGNOSIS — Z79899 Other long term (current) drug therapy: Secondary | ICD-10-CM

## 2016-09-27 DIAGNOSIS — Z87891 Personal history of nicotine dependence: Secondary | ICD-10-CM

## 2016-09-27 DIAGNOSIS — Z811 Family history of alcohol abuse and dependence: Secondary | ICD-10-CM

## 2016-09-27 MED ORDER — GABAPENTIN 300 MG PO CAPS
300.0000 mg | ORAL_CAPSULE | Freq: Three times a day (TID) | ORAL | Status: DC
  Administered 2016-09-27 – 2016-09-30 (×10): 300 mg via ORAL
  Filled 2016-09-27 (×2): qty 1
  Filled 2016-09-27: qty 42
  Filled 2016-09-27 (×4): qty 1
  Filled 2016-09-27: qty 42
  Filled 2016-09-27 (×3): qty 1
  Filled 2016-09-27: qty 42
  Filled 2016-09-27 (×2): qty 1

## 2016-09-27 MED ORDER — DIPHENHYDRAMINE HCL 25 MG PO CAPS
ORAL_CAPSULE | ORAL | Status: AC
  Filled 2016-09-27: qty 1

## 2016-09-27 MED ORDER — NICOTINE POLACRILEX 2 MG MT GUM
2.0000 mg | CHEWING_GUM | OROMUCOSAL | Status: DC | PRN
  Administered 2016-09-27 – 2016-09-30 (×10): 2 mg via ORAL
  Filled 2016-09-27 (×2): qty 1

## 2016-09-27 MED ORDER — HYDROXYZINE HCL 25 MG PO TABS
25.0000 mg | ORAL_TABLET | Freq: Four times a day (QID) | ORAL | Status: DC | PRN
  Administered 2016-09-27 – 2016-09-30 (×6): 25 mg via ORAL
  Filled 2016-09-27 (×3): qty 1
  Filled 2016-09-27: qty 20
  Filled 2016-09-27 (×2): qty 1

## 2016-09-27 MED ORDER — DIPHENHYDRAMINE HCL 25 MG PO CAPS
25.0000 mg | ORAL_CAPSULE | Freq: Once | ORAL | Status: AC
Start: 2016-09-27 — End: 2016-09-27
  Administered 2016-09-27: 25 mg via ORAL

## 2016-09-27 MED ORDER — TRAZODONE HCL 100 MG PO TABS
100.0000 mg | ORAL_TABLET | Freq: Every day | ORAL | Status: DC
  Administered 2016-09-27 – 2016-09-29 (×3): 100 mg via ORAL
  Filled 2016-09-27 (×4): qty 1
  Filled 2016-09-27 (×2): qty 14

## 2016-09-27 NOTE — [facility] Group Notes (Signed)
[facility] LCSW Group Therapy  09/27/2016 3:27 PM  Type of Therapy:  Group Therapy  Participation Level:  Active  Participation Quality:  Attentive  Affect:  Appropriate  Cognitive:  Alert and Oriented  Insight:  Engaged  Engagement in Therapy:  Improving  Modes of Intervention:  Discussion, Education, Exploration, Problem-solving, Rapport Building, Socialization and Support  Summary of Progress/Problems: Feelings around Relapse. Group members discussed the meaning of relapse and shared personal stories of relapse, how it affected them and others, and how they perceived themselves during this time. Group members were encouraged to identify triggers, warning signs and coping skills used when facing the possibility of relapse. Social supports were discussed and explored in detail. Post Acute Withdrawal Syndrome (handout provided) was introduced and examined. Pt's were encouraged to ask questions, talk about key points associated with PAWS, and process this information in terms of relapse prevention.   Lazaria Schaben N Smart LCSW 09/27/2016, 3:27 PM

## 2016-09-27 NOTE — Progress Notes (Signed)
Data. Patient denies HI/AVH. Does endorse passive SI, without a plan that can be carried out while on the unit. Her plan is, "To take pills". Patient is able to verbally contract for safety on the unit and to come to staff before acting on any self harm thoughts/feelings.  Patient interacting well with staff and other patients. Affect is bright most of the shift. On her self assessment she reports 7/10 for depression, 9/10 for hopelessness and 8/10 for anxiety. Her goal for today is: "Try to stay positive". Patient notified nurse at 6:15pm that she was experiencing a rash to the throat area of her neck. Slight redness and light rash observed. Patient C/O, "Iitching". Denies SOB, throat swelling, rash to any other part of her body, or any other symptoms. NP notified and benadryl ordered x1. Given @ 1818. Action. Emotional support and encouragement offered. Education provided on medication, indications and side effect. Q 15 minute checks done for safety. Response. Safety on the unit maintained through 15 minute checks.  Medications taken as prescribed. Attended groups. Remained calm and appropriate through out shift.

## 2016-09-27 NOTE — [facility] Suicide Risk Assessment (Signed)
[facility] INPATIENT:  Family/Significant Other Suicide Prevention Education  Suicide Prevention Education:  Patient Refusal for Family/Significant Other Suicide Prevention Education: The patient Karen Yates has refused to provide written consent for family/significant other to be provided Family/Significant Other Suicide Prevention Education during admission and/or prior to discharge.  Physician notified.  SPE completed with pt, as pt refused to consent to family contact. SPI pamphlet provided to pt and pt was encouraged to share information with support network, ask questions, and talk about any concerns relating to SPE. Pt denies access to guns/firearms and verbalized understanding of information provided. Mobile Crisis information also provided to pt.   Ariannie Penaloza N Smart LCSW 09/27/2016, 8:30 AM

## 2016-09-27 NOTE — Progress Notes (Signed)
Recreation Therapy Notes  Date: 09/27/16 Time: 0930 Location: 300 [address] Dayroom  Group Topic: Stress Management  Goal Area(s) Addresses:  Patient will verbalize importance of using healthy stress management.  Patient will identify positive emotions associated with healthy stress management.   Behavorial Response:  Engaged  Intervention: Stress Management  Activity :  Choice Meditation.  LRT introduced the stress management technique of meditation.  LRT played Yates meditation focused on choices to allow patients the opportunity to engage in the activity.  Patients were to follow along with the meditation as it played.  Education:  Stress Management, Discharge Planning.   Education Outcome: Acknowledges edcuation/In group clarification offered/Needs additional education  Clinical Observations/Feedback: Pt attended group.    Demetrus Pavao, LRT/CTRS         Karen Yates 09/27/2016 12:53 PM 

## 2016-09-27 NOTE — Tx Team (Signed)
Interdisciplinary Treatment and Diagnostic Plan Update  09/27/2016 Time of Session: 9:30AM Karen Yates MRN: [id number]  Principal Diagnosis: MDD (major depressive disorder), recurrent severe, without psychosis (HCC)  Secondary Diagnoses: Principal Problem:   MDD (major depressive disorder), recurrent severe, without psychosis (HCC)   Current Medications:  Current Facility-Administered Medications  Medication Dose Route Frequency Provider Last Rate Last Dose  . acetaminophen (TYLENOL) tablet 650 mg  650 mg Oral Q4H PRN Charm Rings, NP      . alum & mag hydroxide-simeth (MAALOX/MYLANTA) 200-200-20 MG/5ML suspension 30 mL  30 mL Oral PRN Charm Rings, NP      . ARIPiprazole (ABILIFY) tablet 5 mg  5 mg Oral Daily Craige Cotta, MD   5 mg at 09/27/16 0818  . chlordiazePOXIDE (LIBRIUM) capsule 25 mg  25 mg Oral Q6H PRN Sanjuana Kava, NP      . dextromethorphan-guaiFENesin (MUCINEX DM) 30-600 MG per 12 hr tablet 1 tablet  1 tablet Oral BID Sanjuana Kava, NP   1 tablet at 09/27/16 0817  . fluticasone (FLONASE) 50 MCG/ACT nasal spray 1 spray  1 spray Each Nare Daily Sanjuana Kava, NP   1 spray at 09/27/16 0816  . hydrOXYzine (ATARAX/VISTARIL) tablet 25 mg  25 mg Oral Q6H PRN Sanjuana Kava, NP   25 mg at 09/26/16 1712  . ibuprofen (ADVIL,MOTRIN) tablet 600 mg  600 mg Oral Q8H PRN Charm Rings, NP   600 mg at 09/24/16 1428  . lamoTRIgine (LAMICTAL) tablet 25 mg  25 mg Oral Daily Craige Cotta, MD   25 mg at 09/27/16 0818  . levothyroxine (SYNTHROID, LEVOTHROID) tablet 88 mcg  88 mcg Oral QAC breakfast Charm Rings, NP   88 mcg at 09/27/16 4098  . loperamide (IMODIUM) capsule 2-4 mg  2-4 mg Oral PRN Sanjuana Kava, NP      . magnesium hydroxide (MILK OF MAGNESIA) suspension 30 mL  30 mL Oral Daily PRN Charm Rings, NP      . multivitamin with minerals tablet 1 tablet  1 tablet Oral Daily Sanjuana Kava, NP   1 tablet at 09/27/16 0817  . nicotine (NICODERM CQ - dosed in mg/24 hours)  patch 21 mg  21 mg Transdermal Daily Craige Cotta, MD   21 mg at 09/27/16 0816  . ondansetron (ZOFRAN) tablet 4 mg  4 mg Oral Q8H PRN Charm Rings, NP      . PARoxetine (PAXIL) tablet 10 mg  10 mg Oral Daily Charm Rings, NP   10 mg at 09/27/16 0817  . tetrahydrozoline 0.05 % ophthalmic solution 1-2 drop  1-2 drop Both Eyes QID PRN Craige Cotta, MD   2 drop at 09/26/16 2226  . thiamine (VITAMIN B-1) tablet 100 mg  100 mg Oral Daily Sanjuana Kava, NP   100 mg at 09/27/16 0817  . traZODone (DESYREL) tablet 50 mg  50 mg Oral QHS PRN Craige Cotta, MD   50 mg at 09/26/16 2134   PTA Medications: Prescriptions Prior to Admission  Medication Sig Dispense Refill Last Dose  . ARIPiprazole (ABILIFY) 10 MG tablet Take 1 tablet (10 mg total) by mouth daily. (Patient not taking: Reported on 09/22/2016) 30 tablet 0 Not Taking at Unknown time  . DULoxetine (CYMBALTA) 20 MG capsule Take 1 capsule (20 mg total) by mouth daily. (Patient not taking: Reported on 09/22/2016) 30 capsule 0 Not Taking at Unknown time  . gabapentin (  NEURONTIN) 300 MG capsule Take 300 mg by mouth 3 (three) times daily.   09/22/2016 at Unknown time  . lamoTRIgine (LAMICTAL) 25 MG tablet Take 2 tablets (50 mg total) by mouth daily. (Patient not taking: Reported on 09/22/2016) 60 tablet 0 Not Taking at Unknown time  . levothyroxine (SYNTHROID, LEVOTHROID) 75 MCG tablet Take 1 tablet (75 mcg total) by mouth daily before breakfast. (Patient not taking: Reported on 09/22/2016) 30 tablet 0 Not Taking at Unknown time  . levothyroxine (SYNTHROID, LEVOTHROID) 88 MCG tablet Take 88 mcg by mouth daily before breakfast.   09/22/2016 at Unknown time  . LORazepam (ATIVAN) 1 MG tablet Take 1 mg by mouth 3 (three) times daily as needed for anxiety.   a week at Unknown time  . nicotine (NICODERM CQ - DOSED IN MG/24 HOURS) 14 mg/24hr patch Place 1 patch (14 mg total) onto the skin daily. (Patient not taking: Reported on 09/22/2016) 28 patch 0 Not Taking at  Unknown time  . PARoxetine (PAXIL) 10 MG tablet Take 10 mg by mouth daily.   09/22/2016 at Unknown time  . prazosin (MINIPRESS) 1 MG capsule Take 1 capsule (1 mg total) by mouth at bedtime. (Patient not taking: Reported on 09/22/2016) 30 capsule 0 Not Taking at Unknown time  . QUEtiapine (SEROQUEL) 100 MG tablet Take 100-150 mg by mouth 3 (three) times daily. Takes 100 mg twice daily and then 150 mg at night.   09/22/2016 at Unknown time  . saccharomyces boulardii (FLORASTOR) 250 MG capsule Take 1 capsule (250 mg total) by mouth 2 (two) times daily. (Patient not taking: Reported on 09/22/2016) 60 capsule 0 Not Taking at Unknown time  . [DISCONTINUED] mirtazapine (REMERON) 15 MG tablet Take 1 tablet (15 mg total) by mouth at bedtime. (Patient not taking: Reported on 09/22/2016) 30 tablet 0 Not Taking at Unknown time    Patient Stressors: Financial difficulties Marital or family conflict Substance abuse  Patient Strengths: Wellsite geologistCommunication skills General fund of knowledge Motivation for treatment/growth Physical Health  Treatment Modalities: Medication Management, Group therapy, Case management,  1 to 1 session with clinician, Psychoeducation, Recreational therapy.   Physician Treatment Plan for Primary Diagnosis: MDD (major depressive disorder), recurrent severe, without psychosis (HCC) Long Term Goal(s):     Short Term Goals:    Medication Management: Evaluate patient's response, side effects, and tolerance of medication regimen.  Therapeutic Interventions: 1 to 1 sessions, Unit Group sessions and Medication administration.  Evaluation of Outcomes: Progressing  Physician Treatment Plan for Secondary Diagnosis: Principal Problem:   MDD (major depressive disorder), recurrent severe, without psychosis (HCC)  Long Term Goal(s):     Short Term Goals:       Medication Management: Evaluate patient's response, side effects, and tolerance of medication regimen.  Therapeutic Interventions: 1 to 1  sessions, Unit Group sessions and Medication administration.  Evaluation of Outcomes: Progressing   RN Treatment Plan for Primary Diagnosis: MDD (major depressive disorder), recurrent severe, without psychosis (HCC) Long Term Goal(s): Knowledge of disease and therapeutic regimen to maintain health will improve  Short Term Goals: Ability to remain free from injury will improve, Ability to verbalize feelings will improve and Ability to disclose and discuss suicidal ideas  Medication Management: RN will administer medications as ordered by provider, will assess and evaluate patient's response and provide education to patient for prescribed medication. RN will report any adverse and/or side effects to prescribing provider.  Therapeutic Interventions: 1 on 1 counseling sessions, Psychoeducation, Medication administration, Evaluate responses to treatment,  Monitor vital signs and CBGs as ordered, Perform/monitor CIWA, COWS, AIMS and Fall Risk screenings as ordered, Perform wound care treatments as ordered.  Evaluation of Outcomes: Progressing   LCSW Treatment Plan for Primary Diagnosis: MDD (major depressive disorder), recurrent severe, without psychosis (HCC) Long Term Goal(s): Safe transition to appropriate next level of care at discharge, Engage patient in therapeutic group addressing interpersonal concerns.  Short Term Goals: Engage patient in aftercare planning with referrals and resources, Facilitate patient progression through stages of change regarding substance use diagnoses and concerns and Identify triggers associated with mental health/substance abuse issues  Therapeutic Interventions: Assess for all discharge needs, 1 to 1 time with Social worker, Explore available resources and support systems, Assess for adequacy in community support network, Educate family and significant other(s) on suicide prevention, Complete Psychosocial Assessment, Interpersonal group therapy.  Evaluation of  Outcomes: Progressing   Progress in Treatment: Attending groups: Intermittently  Participating in groups: Minimally, when she attends.  Taking medication as prescribed: Yes. Toleration medication: Yes. Family/Significant other contact made: SPE completed with pt; pt declined to consent to family contact.  Patient understands diagnosis: Yes. Discussing patient identified problems/goals with staff: Yes. Medical problems stabilized or resolved: Yes. Denies suicidal/homicidal ideation:Yes, per self report.  Issues/concerns per patient self-inventory: No. Other: n/a  New problem(s) identified: No, Describe:  n/a  New Short Term/Long Term Goal(s): medication stabilization; detox; development of comprehensive mental wellness/sobriety plan.   Discharge Plan or Barriers: Pt has been referred to both [facility] and [facility] per her request. No beds in foreseeable future at [facility] due to long waitlist. Floydene Flock will be seeing her for screening on Tuesday, 1/16 at 7:45AM.   Reason for Continuation of Hospitalization: Depression Medication stabilization  Estimated Length of Stay: 2-4 days    Attendees: Patient: 09/27/2016 8:27 AM  Physician: Dr. Elna Breslow MD; Dr. Jama Flavors MD 09/27/2016 8:27 AM  Nursing: Letitia Neri RN 09/27/2016 8:27 AM  RN Care Manager: Onnie Boer CM 09/27/2016 8:27 AM  Social Worker: Herbert Seta Smart, LCSW; Vernie Shanks LCSW 09/27/2016 8:27 AM  Recreational Therapist:  09/27/2016 8:27 AM  Other: Gray Bernhardt NP ; Armandina Stammer NP 09/27/2016 8:27 AM  Other:  09/27/2016 8:27 AM  Other: 09/27/2016 8:27 AM    Scribe for Treatment Team: Ledell Peoples Smart, LCSW 09/27/2016 8:27 AM

## 2016-09-27 NOTE — Plan of Care (Signed)
Problem: Education: Goal: Ability to state activities that reduce stress will improve Outcome: Progressing Patient has been attending groups and unit activities.

## 2016-09-27 NOTE — Progress Notes (Addendum)
Patient ID: Karen Yates, female   DOB: Jan 09, 1986, 31 y.o.   MRN: [id number]   Pt currently presents with a sad affect and depressed behavior. Pt reports to writer that their goal is to "figure out where I am going when I leave here." Pt states "I don't know if I want to go where I can smoke cigarettes that is short term or somewhere that is longer term but I can't smoke." Pt reports good sleep with current medication regimen. Interaction with peers and staff improving. Pt complaining of dry eyes, states "they feel gritty."  Pt provided with medications per providers orders. Pt's labs and vitals were monitored throughout the night. Pt supported emotionally and encouraged to express concerns and questions. Pt educated on medications.  Pt's safety ensured with 15 minute and environmental checks. Pt currently denies SI/HI and A/V hallucinations. Pt verbally agrees to seek staff if SI/HI or A/VH occurs and to consult with staff before acting on any harmful thoughts. Will continue POC.
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
## 2016-09-28 LAB — RAPID STREP SCREEN (MED CTR MEBANE ONLY): Streptococcus, Group A Screen (Direct): NEGATIVE

## 2016-09-28 MED ORDER — DIPHENHYDRAMINE HCL 25 MG PO CAPS
25.0000 mg | ORAL_CAPSULE | Freq: Four times a day (QID) | ORAL | Status: DC | PRN
  Administered 2016-09-28 – 2016-09-30 (×2): 25 mg via ORAL
  Filled 2016-09-28 (×2): qty 1

## 2016-09-28 NOTE — Progress Notes (Signed)
Data. Patient denies SI/HI/AVH. Patient interacting well with staff and other patients. Affect has been bright throughout shift and patient has been noted laughing with peers and staff. Multiple PRN requests, for multiple complaints. Patient stated, "At home I would probably just keep taking pills until I fell asleep."Patient completed her self assessment on the Spanish side and mostly in Spanish with some [place]. She reports 6/10 for depression 7/10 for hopelessness and 9/10 for anxiety. Her goal for today is: "Attend all groups".  Action. Emotional support and encouragement offered. Education provided on medication, indications and side effect. Q 15 minute checks done for safety. Response. Safety on the unit maintained through 15 minute checks.  Medications taken as prescribed. Attended groups. Remained calm and appropriate through out shift.

## 2016-09-28 NOTE — Progress Notes (Signed)
Nursing Progress Note: 7p-7a D: Pt currently presents with a anxious affect and behavior. Pt states "I am feeling less depressed today. Interacting with everyone helps. I just really miss my kids. I haven't seen them in 4 months." Interacting appropriately with milieu. Pt reports good sleep with current medication regimen.   A: Pt provided with medications per providers orders. Pt's labs and vitals were monitored throughout the night. Pt supported emotionally and encouraged to express concerns and questions. Pt educated on medications.  R: Pt's safety ensured with 15 minute and environmental checks. Pt currently denies SI/HI/Self Harm and A/V hallucinations. Pt verbally contracts to seek staff if SI/HI or A/VH occurs and to consult with staff before acting on any harmful thoughts. Will continue to monitor.

## 2016-09-28 NOTE — Progress Notes (Signed)
Adult Psychoeducational Group Note  Date:  09/28/2016 Time:  1315 Group Topic/Focus:  Coping With Mental Health Crisis:   The purpose of this group is to help patients identify strategies for coping with mental health crisis.  Group discusses possible causes of crisis and ways to manage them effectively.   Participation Level:  Active  Participation Quality:  Appropriate  Affect:  Appropriate  Cognitive:  Appropriate  Insight: Appropriate  Engagement in Group:  Engaged  Modes of Intervention:  Discussion and Education  Additional Comments:   Rhyland Hinderliter L 09/28/2016, 3:34 PM

## 2016-09-28 NOTE — Progress Notes (Signed)
[facility] Group Notes:  (Nursing/MHT/Case Management/Adjunct)  Date:  09/28/2016  Time:  2045 Type of Therapy:  wrap up group  Participation Level:  Active  Participation Quality:  Appropriate, Attentive, Sharing and Supportive  Affect:  Flat  Cognitive:  Appropriate  Insight:  Improving  Engagement in Group:  Engaged  Modes of Intervention:  Clarification, Education and Support  Summary of Progress/Problems: Pt shared that if she could change one thing that she would like to have visitation with her children and she is grateful that she has the desire to be sober.   Johann CapersMcNeil, Amal Renbarger S 09/28/2016, 10:14 PM

## 2016-09-28 NOTE — Plan of Care (Signed)
Problem: Activity: Goal: Interest or engagement in leisure activities will improve Outcome: Progressing Patient has attend groups and been in the milieu appropriately all shift.
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
## 2016-09-28 NOTE — [facility] Group Notes (Signed)
  [facility] LCSW Group Therapy Note  09/28/2016  at  10:00 AM  Type of Therapy and Topic:  Group Therapy: Avoiding Self-Sabotaging and Enabling Behaviors  Participation Level:  Active  Participation Quality:  Attentive and Sharing  Affect:  Appropriate  Cognitive:  Appropriate  Insight:  Developing/Improving  Engagement in Therapy:  Developing/Improving   Therapeutic models used: Cognitive Behavioral Therapy,  Person-Centered Therapy and Motivational Interviewing  Modes of Intervention:  Discussion, Exploration, Orientation, Rapport Building, Socialization and Support   Summary of Progress/Problems:  The main focus of today's process group was for the patient to identify ways in which they have in the past sabotaged their own recovery. Motivational Interviewing was utilized to identify motivation they may have for wanting to change. The Stages of Change were explained using a handout, and patients identified where they currently are with regard to stages of change. Patient engaged easily and shared at multiple points. Needed some redirection to refrain from telling others what they should do and keep focus on self. Patient identifies herself in action stage of avoiding suicide and substance use.    Carney Bernatherine C Harrill, LCSW

## 2016-09-29 MED ORDER — SACCHAROMYCES BOULARDII 250 MG PO CAPS
250.0000 mg | ORAL_CAPSULE | Freq: Two times a day (BID) | ORAL | Status: DC
  Administered 2016-09-29 – 2016-09-30 (×3): 250 mg via ORAL
  Filled 2016-09-29: qty 1
  Filled 2016-09-29 (×2): qty 28
  Filled 2016-09-29 (×4): qty 1

## 2016-09-29 MED ORDER — SUMATRIPTAN SUCCINATE 25 MG PO TABS
25.0000 mg | ORAL_TABLET | ORAL | Status: DC | PRN
  Administered 2016-09-29 – 2016-09-30 (×5): 25 mg via ORAL
  Filled 2016-09-29 (×5): qty 1

## 2016-09-29 NOTE — Plan of Care (Signed)
Problem: Coping: Goal: Ability to cope will improve Outcome: Progressing Patient is attempting to utilize coping skills and not rely on PRN medications. Patient utilized less medications today and rated her anxiety low overall.

## 2016-09-29 NOTE — Plan of Care (Signed)
Problem: Education: Goal: Ability to make informed decisions regarding treatment will improve Outcome: Progressing Medications reviewed with patient. Patient able to demonstrate understanding. No questions or concerns at this time.

## 2016-09-29 NOTE — Progress Notes (Signed)
Data. Patient denies SI/HI/AVH. Patient interacting well with staff and other patients. Affect much flatter this shift. Patient c/o continual headache that is not relieved with regular PRN medications. NP notified and an order received for Imitrex. Patient reports decrease in pain. Patient reports that today, "I am going to use my coping skills before I come and ask for medication." Action. Emotional support and encouragement offered. Education provided on medication, indications and side effect. Q 15 minute checks done for safety. Response. Safety on the unit maintained through 15 minute checks.  Medications taken as prescribed. Attended groups. Remained calm and appropriate through out shift.

## 2016-09-29 NOTE — Progress Notes (Signed)
Nursing Progress Note: 7p-7a D: Pt currently presents with a anxious/depressed/anxious affect and behavior. Pt states "I am feeling very agitated and anxious today. I don't know why I just feel bad." Interacting minimally with milieu. Pt reports good sleep with current medication regimen.   A: Pt provided with medications per providers orders. Pt's labs and vitals were monitored throughout the night. Pt supported emotionally and encouraged to express concerns and questions. Pt educated on medications.  R: Pt's safety ensured with 15 minute and environmental checks. Pt currently denies SI/HI/Self Harm and A/V hallucinations. Pt verbally contracts to seek staff if SI/HI or A/VH occurs and to consult with staff before acting on any harmful thoughts. Will continue to monitor.

## 2016-09-29 NOTE — Plan of Care (Signed)
Problem: Coping: Goal: Ability to cope will improve Outcome: Progressing Patient stated to nurse, "I will try to use all my coping skills today, before I come and get PRNs."
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
## 2016-09-29 NOTE — Progress Notes (Signed)
Patient did attend the evening speaker AA meeting.  

## 2016-09-29 NOTE — Progress Notes (Addendum)
Nursing Progress Note 7p-7a  D) Patient presents pleasant and cooperative. Patient reports she is "proud" because "i've been really successful at not using my PRN medications and using my coping skills today". Patient does report a headache of 8/10 and mild anxiety. Patient reports "I just need something to relax me at night, but I'm doing much better with not needing medicines". Patient has a bright affect but reports some passive SI. Patient contracts for safety and denies HI/AVH.   A) Emotional support given. Patient medicated with PM orders as prescribed. PRN medicine given for headache and anxiety. Medications reviewed with patient. Patient on q15 min safety checks. Opportunities for questions or concerns presented to patient. Patient encouraged to continue to work on treatment goals.  R) Patient receptive to interaction with nurse. Patient remains safe on the unit at this time. Patient is seen interacting appropriately with peers in the dayroom and the milieu. Will continue to monitor.

## 2016-09-29 NOTE — [facility] Group Notes (Signed)
[facility] Group Notes:  (Nursing/MHT/Case Management/Adjunct)  Date:  09/29/2016  Time:  2:37 PM  Type of Therapy:  Nurse Education  Participation Level:  Active  Participation Quality:  Appropriate and Attentive  Affect:  Appropriate  Cognitive:  Alert and Appropriate  Insight:  Appropriate, Good and Improving  Engagement in Group:  Engaged  Modes of Intervention:  Discussion and Education  Summary of Progress/Problems: Patient was insightful.  Patient was able to discuss coping skills and positive thought changes.  Almira Barenny G Cayce Paschal 09/29/2016, 2:37 PM

## 2016-09-29 NOTE — [facility] Group Notes (Signed)
[facility] LCSW Group Therapy Note   09/29/2016  10:00 until 11:00 AM   Type of Therapy and Topic: Group Therapy: Feelings Around Returning Home & Establishing a Supportive Framework and Activity to Identify signs of Improvement or Decompensation   Participation Level: Active   Description of Group:  Patients first processed thoughts and feelings about up coming discharge. These included fears of upcoming changes, lack of change, new living environments, judgements and expectations from others and overall stigma of MH issues. We then discussed what is a supportive framework? What does it look like feel like and how do I discern it from and unhealthy non-supportive network? Learn how to cope when supports are not helpful and don't support you. Discuss what to do when your family/friends are not supportive.   Therapeutic Goals Addressed in Processing Group:  1. Patient will identify one healthy supportive network that they can use at discharge. 2. Patient will identify one factor of a supportive framework and how to tell it from an unhealthy network. 3. Patient able to identify one coping skill to use when they do not have positive supports from others. 4. Patient will demonstrate ability to communicate their needs through discussion and/or role plays.  Summary of Patient Progress:  Pt engaged easily during group session and shared frequently. She needed some redirection in order to make room for others to share. As patients processed their anxiety about discharge and described healthy supports patient  Shared importance of judgement free supports.  Patient chose a visual to represent decompensation as 'feeling stuck' and improvement as being with my children.  Carney Bernatherine C Shamiah Kahler, LCSW

## 2016-09-30 MED ORDER — LAMOTRIGINE 25 MG PO TABS: 25.0000 mg | ORAL_TABLET | Freq: Every day | ORAL | 0 refills | Status: AC

## 2016-09-30 MED ORDER — TRAZODONE HCL 100 MG PO TABS: 100.0000 mg | ORAL_TABLET | Freq: Every day | ORAL | 0 refills | Status: AC

## 2016-09-30 MED ORDER — ARIPIPRAZOLE 5 MG PO TABS: 5.0000 mg | ORAL_TABLET | Freq: Every day | ORAL | 0 refills | Status: AC

## 2016-09-30 MED ORDER — HYDROXYZINE HCL 25 MG PO TABS: 25.0000 mg | ORAL_TABLET | Freq: Four times a day (QID) | ORAL | 0 refills | Status: AC | PRN

## 2016-09-30 MED ORDER — GABAPENTIN 300 MG PO CAPS: 300.0000 mg | ORAL_CAPSULE | Freq: Three times a day (TID) | ORAL | 0 refills | Status: AC

## 2016-09-30 MED ORDER — PAROXETINE HCL 10 MG PO TABS: 10.0000 mg | ORAL_TABLET | Freq: Every day | ORAL | 0 refills | Status: AC
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
## 2016-09-30 NOTE — Tx Team (Signed)
Interdisciplinary Treatment and Diagnostic Plan Update  09/30/2016 Time of Session: 9:30AM ROWEN HUR MRN: [id number]  Principal Diagnosis: MDD (major depressive disorder), recurrent severe, without psychosis ([place])  Secondary Diagnoses: Principal Problem:   MDD (major depressive disorder), recurrent severe, without psychosis ([place])   Current Medications:  Current Facility-Administered Medications  Medication Dose Route Frequency Provider Last Rate Last Dose  . acetaminophen (TYLENOL) tablet 650 mg  650 mg Oral Q4H PRN [name], NP   650 mg at 09/29/16 0756  . alum & mag hydroxide-simeth (MAALOX/MYLANTA) 200-200-20 MG/5ML suspension 30 mL  30 mL Oral PRN [name], NP      . ARIPiprazole (ABILIFY) tablet 5 mg  5 mg Oral Daily Jenne Campus, MD   5 mg at 09/30/16 0758  . diphenhydrAMINE (BENADRYL) capsule 25 mg  25 mg Oral Q6H PRN Nanci Pina, FNP   25 mg at 09/28/16 1405  . fluticasone (FLONASE) 50 MCG/ACT nasal spray 1 spray  1 spray Each Nare Daily Encarnacion Slates, NP   1 spray at 09/29/16 0754  . gabapentin (NEURONTIN) capsule 300 mg  300 mg Oral TID Encarnacion Slates, NP   300 mg at 09/30/16 0758  . hydrOXYzine (ATARAX/VISTARIL) tablet 25 mg  25 mg Oral Q6H PRN Encarnacion Slates, NP   25 mg at 09/29/16 2110  . ibuprofen (ADVIL,MOTRIN) tablet 600 mg  600 mg Oral Q8H PRN [name], NP   600 mg at 09/29/16 1133  . lamoTRIgine (LAMICTAL) tablet 25 mg  25 mg Oral Daily Jenne Campus, MD   25 mg at 09/30/16 0758  . levothyroxine (SYNTHROID, LEVOTHROID) tablet 88 mcg  88 mcg Oral QAC breakfast [name], NP   88 mcg at 09/30/16 0600  . magnesium hydroxide (MILK OF MAGNESIA) suspension 30 mL  30 mL Oral Daily PRN [name], NP      . multivitamin with minerals tablet 1 tablet  1 tablet Oral Daily Encarnacion Slates, NP   1 tablet at 09/30/16 0759  . nicotine polacrilex (NICORETTE) gum 2 mg  2 mg Oral PRN Rozetta Nunnery, NP   2 mg at 09/30/16 0805  . ondansetron (ZOFRAN)  tablet 4 mg  4 mg Oral Q8H PRN [name], NP   4 mg at 09/30/16 0804  . PARoxetine (PAXIL) tablet 10 mg  10 mg Oral Daily [name], NP   10 mg at 09/30/16 0759  . saccharomyces boulardii (FLORASTOR) capsule 250 mg  250 mg Oral BID Nanci Pina, FNP   250 mg at 09/30/16 0758  . SUMAtriptan (IMITREX) tablet 25 mg  25 mg Oral Q2H PRN Nanci Pina, FNP   25 mg at 09/30/16 0600  . tetrahydrozoline 0.05 % ophthalmic solution 1-2 drop  1-2 drop Both Eyes QID PRN Jenne Campus, MD   2 drop at 09/30/16 0600  . thiamine (VITAMIN B-1) tablet 100 mg  100 mg Oral Daily Encarnacion Slates, NP   100 mg at 09/30/16 0758  . traZODone (DESYREL) tablet 100 mg  100 mg Oral QHS Encarnacion Slates, NP   100 mg at 09/29/16 2218   PTA Medications: Prescriptions Prior to Admission  Medication Sig Dispense Refill Last Dose  . ARIPiprazole (ABILIFY) 10 MG tablet Take 1 tablet (10 mg total) by mouth daily. (Patient not taking: Reported on 09/22/2016) 30 tablet 0 Not Taking at Unknown time  . DULoxetine (CYMBALTA) 20 MG capsule Take  1 capsule (20 mg total) by mouth daily. (Patient not taking: Reported on 09/22/2016) 30 capsule 0 Not Taking at Unknown time  . gabapentin (NEURONTIN) 300 MG capsule Take 300 mg by mouth 3 (three) times daily.   09/22/2016 at Unknown time  . lamoTRIgine (LAMICTAL) 25 MG tablet Take 2 tablets (50 mg total) by mouth daily. (Patient not taking: Reported on 09/22/2016) 60 tablet 0 Not Taking at Unknown time  . levothyroxine (SYNTHROID, LEVOTHROID) 75 MCG tablet Take 1 tablet (75 mcg total) by mouth daily before breakfast. (Patient not taking: Reported on 09/22/2016) 30 tablet 0 Not Taking at Unknown time  . levothyroxine (SYNTHROID, LEVOTHROID) 88 MCG tablet Take 88 mcg by mouth daily before breakfast.   09/22/2016 at Unknown time  . LORazepam (ATIVAN) 1 MG tablet Take 1 mg by mouth 3 (three) times daily as needed for anxiety.   a week at Unknown time  . nicotine (NICODERM CQ - DOSED IN MG/24 HOURS) 14  mg/24hr patch Place 1 patch (14 mg total) onto the skin daily. (Patient not taking: Reported on 09/22/2016) 28 patch 0 Not Taking at Unknown time  . PARoxetine (PAXIL) 10 MG tablet Take 10 mg by mouth daily.   09/22/2016 at Unknown time  . prazosin (MINIPRESS) 1 MG capsule Take 1 capsule (1 mg total) by mouth at bedtime. (Patient not taking: Reported on 09/22/2016) 30 capsule 0 Not Taking at Unknown time  . QUEtiapine (SEROQUEL) 100 MG tablet Take 100-150 mg by mouth 3 (three) times daily. Takes 100 mg twice daily and then 150 mg at night.   09/22/2016 at Unknown time  . saccharomyces boulardii (FLORASTOR) 250 MG capsule Take 1 capsule (250 mg total) by mouth 2 (two) times daily. (Patient not taking: Reported on 09/22/2016) 60 capsule 0 Not Taking at Unknown time  . [DISCONTINUED] mirtazapine (REMERON) 15 MG tablet Take 1 tablet (15 mg total) by mouth at bedtime. (Patient not taking: Reported on 09/22/2016) 30 tablet 0 Not Taking at Unknown time    Patient Stressors: Financial difficulties Marital or family conflict Substance abuse  Patient Strengths: Curator fund of knowledge Motivation for treatment/growth Physical Health  Treatment Modalities: Medication Management, Group therapy, Case management,  1 to 1 session with clinician, Psychoeducation, Recreational therapy.   Physician Treatment Plan for Primary Diagnosis: MDD (major depressive disorder), recurrent severe, without psychosis ([place]) Long Term Goal(s):     Short Term Goals:    Medication Management: Evaluate patient's response, side effects, and tolerance of medication regimen.  Therapeutic Interventions: 1 to 1 sessions, Unit Group sessions and Medication administration.  Evaluation of Outcomes: Adequate for discharge.   Physician Treatment Plan for Secondary Diagnosis: Principal Problem:   MDD (major depressive disorder), recurrent severe, without psychosis ([place])  Long Term Goal(s):     Short Term Goals:        Medication Management: Evaluate patient's response, side effects, and tolerance of medication regimen.  Therapeutic Interventions: 1 to 1 sessions, Unit Group sessions and Medication administration.  Evaluation of Outcomes: Adequate for discharge    RN Treatment Plan for Primary Diagnosis: MDD (major depressive disorder), recurrent severe, without psychosis ([place]) Long Term Goal(s): Knowledge of disease and therapeutic regimen to maintain health will improve  Short Term Goals: Ability to remain free from injury will improve, Ability to verbalize feelings will improve and Ability to disclose and discuss suicidal ideas  Medication Management: RN will administer medications as ordered by provider, will assess and evaluate patient's response and provide education  to patient for prescribed medication. RN will report any adverse and/or side effects to prescribing provider.  Therapeutic Interventions: 1 on 1 counseling sessions, Psychoeducation, Medication administration, Evaluate responses to treatment, Monitor vital signs and CBGs as ordered, Perform/monitor CIWA, COWS, AIMS and Fall Risk screenings as ordered, Perform wound care treatments as ordered.  Evaluation of Outcomes: Adequate for discharge/met    LCSW Treatment Plan for Primary Diagnosis: MDD (major depressive disorder), recurrent severe, without psychosis ([place]) Long Term Goal(s): Safe transition to appropriate next level of care at discharge, Engage patient in therapeutic group addressing interpersonal concerns.  Short Term Goals: Engage patient in aftercare planning with referrals and resources, Facilitate patient progression through stages of change regarding substance use diagnoses and concerns and Identify triggers associated with mental health/substance abuse issues  Therapeutic Interventions: Assess for all discharge needs, 1 to 1 time with Social worker, Explore available resources and support systems, Assess for adequacy in  community support network, Educate family and significant other(s) on suicide prevention, Complete Psychosocial Assessment, Interpersonal group therapy.  Evaluation of Outcomes: Met/adequate for discharge    Progress in Treatment: Attending groups: Yes Participating in groups: Yes Taking medication as prescribed: Yes. Toleration medication: Yes. Family/Significant other contact made: SPE completed with pt; pt declined to consent to family contact.  Patient understands diagnosis: Yes. Discussing patient identified problems/goals with staff: Yes. Medical problems stabilized or resolved: Yes. Denies suicidal/homicidal ideation:Yes, per self report.  Issues/concerns per patient self-inventory: No. Other: n/a  New problem(s) identified: No, Describe:  n/a  New Short Term/Long Term Goal(s): medication stabilization; detox; development of comprehensive mental wellness/sobriety plan.   Discharge Plan or Barriers: Pt has been referred to both [facility] and [facility] per her request. No beds in foreseeable future at [facility] due to long waitlist. Chinita Pester will be seeing her for screening on Tuesday, 1/16 at 7:45AM.   Reason for Continuation of Hospitalization:  Medication management  Estimated Length of Stay: d/c today--pt's boyfriend picking her up.   Attendees: Patient: 09/30/2016 8:19 AM  Physician:  Dr. Parke Poisson MD 09/30/2016 8:19 AM  Nursing:Patrice; Opal Sidles RN 09/30/2016 8:19 AM  RN Care Manager: Lars Pinks CM 09/30/2016 8:19 AM  Social Worker: Maxie Better, LCSW; Adriana Reams LCSW 09/30/2016 8:19 AM  Recreational Therapist:  09/30/2016 8:19 AM  Other:  Lindell Spar NP 09/30/2016 8:19 AM  Other:  09/30/2016 8:19 AM  Other: 09/30/2016 8:19 AM    Scribe for Treatment Team: [place], LCSW 09/30/2016 8:19 AM

## 2016-09-30 NOTE — Progress Notes (Signed)
Karen Yates was D/C from the unit to lobby accompanied by her boyfriend.  She was pleasant and cooperative. She voiced no SI/HI or A/V halluciations. She denies any pain or discomfort.  D/C instructions and medications reviewed with pt.  Pt. verbalized understanding of medications and d/c instructions.   All belongings (from locker 33) returned to pt. Q 15 min checks maintained until discharge.  Florenda left the unit in no apparent distress.

## 2016-09-30 NOTE — Progress Notes (Signed)
  [facility] :  Will you be returning to the same living situation after discharge:  No. Pt staying with boyfriend tonight and [facility].  At discharge, do you have transportation home?: Yes,  pt's boyfriend Do you have the ability to pay for your medications: Yes,  mental health  Release of information consent forms completed and submitted to medical records by CSW.  Patient to Follow up at: Follow-up Information    [facility] Follow up on 10/01/2016.   Why:  Appt on this date at 7:45AM for screening/possible admission. Please [place]/proof [place], 2 week supply of medications, and 30 day prescriptions. Thank you.  Contact information: [address] [place] [address] [phone]        [facility] Follow up.   Specialty:  Behavioral Health Why:  Walk in between 8am-9am Monday through Friday for hospital follow-up/medication management/assessment for counseling services.  Contact information: [address] [place] [address] [phone]           Next level of care provider has access to [facility] Link:no  Safety Planning and Suicide Prevention discussed: Yes,  SPE completed with pt; pt declined to consent to family contact. SPI pamphlet and Mobile Crisis information provided.  Have you used any form of tobacco in the last 30 days? (Cigarettes, Smokeless Tobacco, Cigars, and/or Pipes): Yes  Has patient been referred to the Quitline?: Patient refused referral  Patient has been referred for addiction treatment: Yes  Zalika Tieszen N Smart LSCW 09/30/2016, 11:14 AM

## 2016-09-30 NOTE — [facility] Suicide Risk Assessment (Addendum)
[facility]   Principal Problem: MDD (major depressive disorder), recurrent severe, without psychosis ([facility]) Discharge Diagnoses:  Patient Active Problem List   Diagnosis Date Noted  . MDD (major depressive disorder), recurrent severe, without psychosis (HCC) [F33.2] 09/23/2016  . Bipolar I disorder, most recent episode (or current) depressed, severe, specified as with psychotic behavior (HCC) [F31.5] 06/02/2016  . Bipolar II disorder (HCC) [F31.81] 05/10/2016  . Alcohol use disorder, severe, dependence (HCC) [F10.20] 05/10/2016  . Major depressive disorder, recurrent severe without psychotic features (HCC) [F33.2] 05/09/2016  . Alcohol abuse [F10.10] 05/09/2016  . Major depressive disorder, recurrent episode, severe, with psychosis (HCC) [F33.3] 05/09/2016  . S/P cesarean section [Z98.891] 07/05/2015    Total Time spent with patient: 30 minutes  Musculoskeletal: Strength & Muscle Tone: within normal limits Gait & Station: normal Patient leans: N/A  Psychiatric Specialty Exam: ROS describes history of headaches/migraines, no chest pain, mild nausea at times, no vomiting   Blood pressure 130/90, pulse 90, temperature 98.2 F (36.8 C), temperature source Oral, resp. rate 16, height 5\' 7"  (1.702 m), weight 105.2 kg (232 lb), unknown if currently breastfeeding.Body mass index is 36.34 kg/m.  General Appearance: Well Groomed  Eye Contact::  Good  Speech:  Normal Rate409  Volume:  Normal  Mood:  improved, currently states " I am very close to feeling normal again"  Affect:  Appropriate and Full Range  Thought Process:  Linear  Orientation:  Full (Time, Place, and Person)  Thought Content:  denies halllucinations, no delusions, not internally preoccupied   Suicidal Thoughts:  No denies any suicidal or self injurious ideations, no homicidal ideations or violent ideations  Homicidal Thoughts:  No  Memory:  recent and remote grossly intact   Judgement:  Other:   improved   Insight:  improved  Psychomotor Activity:  Normal  Concentration:  Good  Recall:  Good  Fund of Knowledge:Negative  Language: Negative  Akathisia:  No  Handed:  Right  AIMS (if indicated):     Assets:  Communication Skills Desire for Improvement Resilience  Sleep:  Number of Hours: 6.25  Cognition: WNL  ADL's:  Intact   Mental Status Per Nursing Assessment::   On Admission:  Suicidal ideation indicated by patient  Demographic Factors:  31 year old separated female, lives with SO, has three children  Loss Factors: Does not have custody of her children at this time, who are with their father  Historical Factors: History of depression, history of self cutting, not in several months , history of suicide attempts in the past  History of alcohol dependence   Risk Reduction Factors:   Responsible for children under 30 years of age, Sense of responsibility to family, Living with another person, especially a relative, Positive social support and Positive coping skills or problem solving skills  Continued Clinical Symptoms:  At this time patient is improved compared to admission - presents with improved mood, improved range of affect, no thought disorder, no suicidal or self injurious ideations, no homicidal ideations, no psychotic symptoms, no residual withdrawal symptoms. Denies medication side effects. Denies cravings for alcohol and expresses high level of motivation in sobriety/recovery efforts   Cognitive Features That Contribute To Risk:  No gross cognitive deficits noted upon discharge. Is alert , attentive, and oriented x 3    Suicide Risk:  Mild:  Suicidal ideation of limited frequency, intensity, duration, and specificity.  There are no identifiable plans, no associated intent, mild dysphoria and related symptoms, good self-control (  both objective and subjective assessment), few other risk factors, and identifiable protective factors, including available and  accessible social support.  Follow-up Information    [facility] Follow up on 10/01/2016.   Why:  Appt on this date at 7:45AM for screening/possible admission. Please bring photo ID/proof of [employer] residency, 2 week supply of medications, and 30 day prescriptions. Thank you.  Contact information: [address] [place] [address] [phone]        [facility] Follow up.   Specialty:  Behavioral Health Why:  Walk in between 8am-9am Monday through Friday for hospital follow-up/medication management/assessment for counseling services.  Contact information: [address] [place] [address] [phone]           Plan Of Care/Follow-up recommendations:  Activity:  as tolerated  Diet:  Regular  Tests:  NA Other:  See below  Patient is leaving in good spirits  Plans to go home for 1day, then to [facility] rehab program  Tomorrow She has an established PCP , Dr. Ashley Royalty in [facility], who follows her for medical issues  Regular, ongoing 12 step program participation encouraged .   Nehemiah Massed, MD 09/30/2016, 4:04 PM

## 2016-09-30 NOTE — Tx Team (Signed)
Interdisciplinary Treatment and Diagnostic Plan Update  09/30/2016 Time of Session: 9:30AM Karen Yates MRN: [id number]  Principal Diagnosis: MDD (major depressive disorder), recurrent severe, without psychosis ([place])  Secondary Diagnoses: Principal Problem:   MDD (major depressive disorder), recurrent severe, without psychosis ([place])   Current Medications:  Current Facility-Administered Medications  Medication Dose Route Frequency Provider Last Rate Last Dose  . acetaminophen (TYLENOL) tablet 650 mg  650 mg Oral Q4H PRN [name], NP   650 mg at 09/29/16 0756  . alum & mag hydroxide-simeth (MAALOX/MYLANTA) 200-200-20 MG/5ML suspension 30 mL  30 mL Oral PRN [name], NP      . ARIPiprazole (ABILIFY) tablet 5 mg  5 mg Oral Daily Jenne Campus, MD   5 mg at 09/30/16 0758  . diphenhydrAMINE (BENADRYL) capsule 25 mg  25 mg Oral Q6H PRN Nanci Pina, FNP   25 mg at 09/28/16 1405  . fluticasone (FLONASE) 50 MCG/ACT nasal spray 1 spray  1 spray Each Nare Daily Encarnacion Slates, NP   1 spray at 09/29/16 0754  . gabapentin (NEURONTIN) capsule 300 mg  300 mg Oral TID Encarnacion Slates, NP   300 mg at 09/30/16 0758  . hydrOXYzine (ATARAX/VISTARIL) tablet 25 mg  25 mg Oral Q6H PRN Encarnacion Slates, NP   25 mg at 09/30/16 1010  . ibuprofen (ADVIL,MOTRIN) tablet 600 mg  600 mg Oral Q8H PRN [name], NP   600 mg at 09/29/16 1133  . lamoTRIgine (LAMICTAL) tablet 25 mg  25 mg Oral Daily Jenne Campus, MD   25 mg at 09/30/16 0758  . levothyroxine (SYNTHROID, LEVOTHROID) tablet 88 mcg  88 mcg Oral QAC breakfast [name], NP   88 mcg at 09/30/16 0600  . magnesium hydroxide (MILK OF MAGNESIA) suspension 30 mL  30 mL Oral Daily PRN [name], NP      . multivitamin with minerals tablet 1 tablet  1 tablet Oral Daily Encarnacion Slates, NP   1 tablet at 09/30/16 0759  . nicotine polacrilex (NICORETTE) gum 2 mg  2 mg Oral PRN Rozetta Nunnery, NP   2 mg at 09/30/16 0805  . ondansetron (ZOFRAN)  tablet 4 mg  4 mg Oral Q8H PRN [name], NP   4 mg at 09/30/16 0804  . PARoxetine (PAXIL) tablet 10 mg  10 mg Oral Daily [name], NP   10 mg at 09/30/16 0759  . saccharomyces boulardii (FLORASTOR) capsule 250 mg  250 mg Oral BID Nanci Pina, FNP   250 mg at 09/30/16 0758  . SUMAtriptan (IMITREX) tablet 25 mg  25 mg Oral Q2H PRN Nanci Pina, FNP   25 mg at 09/30/16 1010  . tetrahydrozoline 0.05 % ophthalmic solution 1-2 drop  1-2 drop Both Eyes QID PRN Jenne Campus, MD   2 drop at 09/30/16 0600  . thiamine (VITAMIN B-1) tablet 100 mg  100 mg Oral Daily Encarnacion Slates, NP   100 mg at 09/30/16 0758  . traZODone (DESYREL) tablet 100 mg  100 mg Oral QHS Encarnacion Slates, NP   100 mg at 09/29/16 2218   PTA Medications: Prescriptions Prior to Admission  Medication Sig Dispense Refill Last Dose  . ARIPiprazole (ABILIFY) 10 MG tablet Take 1 tablet (10 mg total) by mouth daily. (Patient not taking: Reported on 09/22/2016) 30 tablet 0 Not Taking at Unknown time  . DULoxetine (CYMBALTA) 20 MG capsule Take  1 capsule (20 mg total) by mouth daily. (Patient not taking: Reported on 09/22/2016) 30 capsule 0 Not Taking at Unknown time  . gabapentin (NEURONTIN) 300 MG capsule Take 300 mg by mouth 3 (three) times daily.   09/22/2016 at Unknown time  . lamoTRIgine (LAMICTAL) 25 MG tablet Take 2 tablets (50 mg total) by mouth daily. (Patient not taking: Reported on 09/22/2016) 60 tablet 0 Not Taking at Unknown time  . levothyroxine (SYNTHROID, LEVOTHROID) 75 MCG tablet Take 1 tablet (75 mcg total) by mouth daily before breakfast. (Patient not taking: Reported on 09/22/2016) 30 tablet 0 Not Taking at Unknown time  . levothyroxine (SYNTHROID, LEVOTHROID) 88 MCG tablet Take 88 mcg by mouth daily before breakfast.   09/22/2016 at Unknown time  . LORazepam (ATIVAN) 1 MG tablet Take 1 mg by mouth 3 (three) times daily as needed for anxiety.   a week at Unknown time  . nicotine (NICODERM CQ - DOSED IN MG/24 HOURS) 14  mg/24hr patch Place 1 patch (14 mg total) onto the skin daily. (Patient not taking: Reported on 09/22/2016) 28 patch 0 Not Taking at Unknown time  . PARoxetine (PAXIL) 10 MG tablet Take 10 mg by mouth daily.   09/22/2016 at Unknown time  . prazosin (MINIPRESS) 1 MG capsule Take 1 capsule (1 mg total) by mouth at bedtime. (Patient not taking: Reported on 09/22/2016) 30 capsule 0 Not Taking at Unknown time  . QUEtiapine (SEROQUEL) 100 MG tablet Take 100-150 mg by mouth 3 (three) times daily. Takes 100 mg twice daily and then 150 mg at night.   09/22/2016 at Unknown time  . saccharomyces boulardii (FLORASTOR) 250 MG capsule Take 1 capsule (250 mg total) by mouth 2 (two) times daily. (Patient not taking: Reported on 09/22/2016) 60 capsule 0 Not Taking at Unknown time  . [DISCONTINUED] mirtazapine (REMERON) 15 MG tablet Take 1 tablet (15 mg total) by mouth at bedtime. (Patient not taking: Reported on 09/22/2016) 30 tablet 0 Not Taking at Unknown time    Patient Stressors: Financial difficulties Marital or family conflict Substance abuse  Patient Strengths: Curator fund of knowledge Motivation for treatment/growth Physical Health  Treatment Modalities: Medication Management, Group therapy, Case management,  1 to 1 session with clinician, Psychoeducation, Recreational therapy.   Physician Treatment Plan for Primary Diagnosis: MDD (major depressive disorder), recurrent severe, without psychosis ([place]) Long Term Goal(s):     Short Term Goals:    Medication Management: Evaluate patient's response, side effects, and tolerance of medication regimen.  Therapeutic Interventions: 1 to 1 sessions, Unit Group sessions and Medication administration.  Evaluation of Outcomes: Met  Physician Treatment Plan for Secondary Diagnosis: Principal Problem:   MDD (major depressive disorder), recurrent severe, without psychosis ([place])  Long Term Goal(s):     Short Term Goals:       Medication  Management: Evaluate patient's response, side effects, and tolerance of medication regimen.  Therapeutic Interventions: 1 to 1 sessions, Unit Group sessions and Medication administration.  Evaluation of Outcomes: Met   RN Treatment Plan for Primary Diagnosis: MDD (major depressive disorder), recurrent severe, without psychosis ([place]) Long Term Goal(s): Knowledge of disease and therapeutic regimen to maintain health will improve  Short Term Goals: Ability to remain free from injury will improve, Ability to verbalize feelings will improve and Ability to disclose and discuss suicidal ideas  Medication Management: RN will administer medications as ordered by provider, will assess and evaluate patient's response and provide education to patient for prescribed medication. RN  will report any adverse and/or side effects to prescribing provider.  Therapeutic Interventions: 1 on 1 counseling sessions, Psychoeducation, Medication administration, Evaluate responses to treatment, Monitor vital signs and CBGs as ordered, Perform/monitor CIWA, COWS, AIMS and Fall Risk screenings as ordered, Perform wound care treatments as ordered.  Evaluation of Outcomes: Met   LCSW Treatment Plan for Primary Diagnosis: MDD (major depressive disorder), recurrent severe, without psychosis ([place]) Long Term Goal(s): Safe transition to appropriate next level of care at discharge, Engage patient in therapeutic group addressing interpersonal concerns.  Short Term Goals: Engage patient in aftercare planning with referrals and resources, Facilitate patient progression through stages of change regarding substance use diagnoses and concerns and Identify triggers associated with mental health/substance abuse issues  Therapeutic Interventions: Assess for all discharge needs, 1 to 1 time with Social worker, Explore available resources and support systems, Assess for adequacy in community support network, Educate family and significant  other(s) on suicide prevention, Complete Psychosocial Assessment, Interpersonal group therapy.  Evaluation of Outcomes: met   Progress in Treatment: Attending groups: Yes Participating in groups: Yes Taking medication as prescribed: Yes. Toleration medication: Yes. Family/Significant other contact made: SPE completed with pt; pt declined to consent to family contact.  Patient understands diagnosis: Yes. Discussing patient identified problems/goals with staff: Yes. Medical problems stabilized or resolved: Yes. Denies suicidal/homicidal ideation:Yes, per self report.  Issues/concerns per patient self-inventory: No. Other: n/a  New problem(s) identified: No, Describe:  n/a  New Short Term/Long Term Goal(s): medication stabilization; detox; development of comprehensive mental wellness/sobriety plan.   Discharge Plan or Barriers: Pt has been referred to both [facility] and [facility] per her request. No beds in foreseeable future at [facility] due to long waitlist. Chinita Pester will be seeing her for screening on Tuesday, 1/16 at 7:45AM.   Reason for Continuation of Hospitalization: none  Estimated Length of Stay: d/c today  Attendees: Patient: 09/30/2016 11:15 AM  Physician: Dr. Parke Poisson MD 09/30/2016 11:15 AM  Nursing: Jonni Sanger RN 09/30/2016 11:15 AM  RN Care Manager: Lars Pinks CM 09/30/2016 11:15 AM  Social Worker: Nira Conn Smart, LCSW; Adriana Reams LCSW 09/30/2016 11:15 AM  Recreational Therapist:  09/30/2016 11:15 AM  Other: Lindell Spar NP 09/30/2016 11:15 AM  Other:  09/30/2016 11:15 AM  Other: 09/30/2016 11:15 AM    Scribe for Treatment Team: Kimber Relic Smart, LCSW 09/30/2016 11:15 AM

## 2016-09-30 NOTE — Progress Notes (Signed)
Recreation Therapy Notes  Date: 09/30/16 Time: 0930 Location: [address] Dayroom  Group Topic: Stress Management  Goal Area(s) Addresses:  Patient will verbalize importance of using healthy stress management.  Patient will identify positive emotions associated with healthy stress management.   Behavioral Response: Engaged  Intervention: Stress Management  Activity :  Peaceful Waves Guided Imagery.  LRT introduced the stress management concept of guided imagery.  LRT read a script to allow patients the opportunity engage and participate in the activity.  Patients were to follow along as LRT read script to participate in the activity.  Education:  Stress Management, Discharge Planning.   Education Outcome: Acknowledges edcuation/In group clarification offered/Needs additional education  Clinical Observations/Feedback: Pt attended group.   Caroll RancherMarjette Maela [employer], LRT/CTRS     Caroll RancherLindsay, Marly Schuld A 09/30/2016 12:05 PM

## 2016-10-01 LAB — CULTURE, GROUP A STREP (THRC)

## 2016-10-07 ENCOUNTER — Emergency Department (HOSPITAL_COMMUNITY)
Admission: EM | Admit: 2016-10-07 | Discharge: 2016-10-09 | Disposition: A | Discharge status: Home / Self Care | Payer: PRIVATE HEALTH INSURANCE | Attending: Emergency Medicine | Admitting: Emergency Medicine

## 2016-10-07 ENCOUNTER — Encounter (HOSPITAL_COMMUNITY): Payer: Self-pay | Admitting: Emergency Medicine

## 2016-10-07 DIAGNOSIS — F1020 Alcohol dependence, uncomplicated: Secondary | ICD-10-CM | POA: Diagnosis present

## 2016-10-07 DIAGNOSIS — R45851 Suicidal ideations: Secondary | ICD-10-CM

## 2016-10-07 DIAGNOSIS — R443 Hallucinations, unspecified: Secondary | ICD-10-CM | POA: Insufficient documentation

## 2016-10-07 DIAGNOSIS — E039 Hypothyroidism, unspecified: Secondary | ICD-10-CM | POA: Insufficient documentation

## 2016-10-07 DIAGNOSIS — Z87891 Personal history of nicotine dependence: Secondary | ICD-10-CM | POA: Insufficient documentation

## 2016-10-07 DIAGNOSIS — Z79899 Other long term (current) drug therapy: Secondary | ICD-10-CM | POA: Insufficient documentation

## 2016-10-07 DIAGNOSIS — J45909 Unspecified asthma, uncomplicated: Secondary | ICD-10-CM | POA: Insufficient documentation

## 2016-10-07 DIAGNOSIS — F315 Bipolar disorder, current episode depressed, severe, with psychotic features: Secondary | ICD-10-CM | POA: Diagnosis present

## 2016-10-07 LAB — RAPID URINE DRUG SCREEN, HOSP PERFORMED
AMPHETAMINES: NOT DETECTED
Barbiturates: NOT DETECTED
Benzodiazepines: POSITIVE — AB
COCAINE: NOT DETECTED
OPIATES: NOT DETECTED
TETRAHYDROCANNABINOL: NOT DETECTED

## 2016-10-07 LAB — SALICYLATE LEVEL

## 2016-10-07 LAB — COMPREHENSIVE METABOLIC PANEL
ALT: 46 U/L (ref 14–54)
AST: 75 U/L — AB (ref 15–41)
Albumin: 4.2 g/dL (ref 3.5–5.0)
Alkaline Phosphatase: 52 U/L (ref 38–126)
Anion gap: 7 (ref 5–15)
BUN: 8 mg/dL (ref 6–20)
CO2: 27 mmol/L (ref 22–32)
CREATININE: 0.7 mg/dL (ref 0.44–1.00)
Calcium: 9.4 mg/dL (ref 8.9–10.3)
Chloride: 105 mmol/L (ref 101–111)
Glucose, Bld: 88 mg/dL (ref 65–99)
POTASSIUM: 3.6 mmol/L (ref 3.5–5.1)
SODIUM: 139 mmol/L (ref 135–145)
Total Bilirubin: 0.4 mg/dL (ref 0.3–1.2)
Total Protein: 7.1 g/dL (ref 6.5–8.1)

## 2016-10-07 LAB — CBC
HCT: 35.2 % — ABNORMAL LOW (ref 36.0–46.0)
HEMOGLOBIN: 11.4 g/dL — AB (ref 12.0–15.0)
MCH: 28.1 pg (ref 26.0–34.0)
MCHC: 32.4 g/dL (ref 30.0–36.0)
MCV: 86.7 fL (ref 78.0–100.0)
PLATELETS: 384 10*3/uL (ref 150–400)
RBC: 4.06 MIL/uL (ref 3.87–5.11)
RDW: 15.1 % (ref 11.5–15.5)
WBC: 11.6 10*3/uL — AB (ref 4.0–10.5)

## 2016-10-07 LAB — ETHANOL

## 2016-10-07 LAB — I-STAT BETA HCG BLOOD, ED (MC, WL, AP ONLY): I-stat hCG, quantitative: <5 m[IU]/mL (ref <5)

## 2016-10-07 LAB — ACETAMINOPHEN LEVEL: Acetaminophen (Tylenol), Serum: <10 ug/mL — ABNORMAL LOW (ref 10–30)
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
## 2016-10-07 NOTE — ED Notes (Signed)
Pt talking to TTS at this time.  ?

## 2016-10-07 NOTE — ED Triage Notes (Addendum)
Pt sts has been [facility] for [facility] for rehab and is having Ah/VH and SI with plan to use trash bag over her head; pt also sts feels like she has a sinus infection
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
## 2016-10-07 NOTE — Progress Notes (Signed)
This Clinical research associatewriter as made multiple attempts to contact medical staff to complete this consult but unable to reach staff.      Maryelizabeth Rowanressa Issaiah Seabrooks, MSW, Tinnie GensLCSW, LCAS, CCSI [facility] Triage Specialist [phone]

## 2016-10-08 MED ORDER — PAROXETINE HCL 20 MG PO TABS
10.0000 mg | ORAL_TABLET | Freq: Every day | ORAL | Status: DC
  Administered 2016-10-08 – 2016-10-09 (×2): 10 mg via ORAL
  Filled 2016-10-08 (×2): qty 1

## 2016-10-08 MED ORDER — LORAZEPAM 1 MG PO TABS
1.0000 mg | ORAL_TABLET | Freq: Once | ORAL | Status: AC
  Administered 2016-10-08: 1 mg via ORAL
  Filled 2016-10-08: qty 1

## 2016-10-08 MED ORDER — HALOPERIDOL 5 MG PO TABS: 5.0000 mg | ORAL_TABLET | Freq: Two times a day (BID) | ORAL | Status: DC

## 2016-10-08 MED ORDER — HYDROXYZINE HCL 25 MG PO TABS
25.0000 mg | ORAL_TABLET | Freq: Four times a day (QID) | ORAL | Status: DC | PRN
  Administered 2016-10-08 (×3): 25 mg via ORAL
  Filled 2016-10-08 (×3): qty 1

## 2016-10-08 MED ORDER — TRAZODONE HCL 100 MG PO TABS
100.0000 mg | ORAL_TABLET | Freq: Every day | ORAL | Status: DC
  Administered 2016-10-08 (×2): 100 mg via ORAL
  Filled 2016-10-08 (×2): qty 1

## 2016-10-08 MED ORDER — LAMOTRIGINE 25 MG PO TABS
25.0000 mg | ORAL_TABLET | Freq: Every day | ORAL | Status: DC
  Administered 2016-10-08 – 2016-10-09 (×2): 25 mg via ORAL
  Filled 2016-10-08 (×2): qty 1

## 2016-10-08 MED ORDER — LEVOTHYROXINE SODIUM 88 MCG PO TABS
88.0000 ug | ORAL_TABLET | Freq: Every day | ORAL | Status: DC
  Administered 2016-10-08 – 2016-10-09 (×2): 88 ug via ORAL
  Filled 2016-10-08 (×3): qty 1

## 2016-10-08 MED ORDER — GABAPENTIN 300 MG PO CAPS
300.0000 mg | ORAL_CAPSULE | Freq: Three times a day (TID) | ORAL | Status: DC
Start: 2016-10-08 — End: 2016-10-09
  Administered 2016-10-08 – 2016-10-09 (×4): 300 mg via ORAL
  Filled 2016-10-08 (×4): qty 1

## 2016-10-08 MED ORDER — DIVALPROEX SODIUM 250 MG PO DR TAB
250.0000 mg | DELAYED_RELEASE_TABLET | Freq: Two times a day (BID) | ORAL | Status: DC
Start: 2016-10-08 — End: 2016-10-08

## 2016-10-08 MED ORDER — ARIPIPRAZOLE 5 MG PO TABS
5.0000 mg | ORAL_TABLET | Freq: Every day | ORAL | Status: DC
  Administered 2016-10-08 – 2016-10-09 (×2): 5 mg via ORAL
  Filled 2016-10-08 (×2): qty 1

## 2016-10-08 NOTE — ED Notes (Signed)
Pt asking if she may leave d/t states she does not feel like [facility] after she spoke w/[facility], NP, [facility]. Requested for pt wait until after hear back from [place] Hillsonrad. Advised she will do so.
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
## 2016-10-08 NOTE — ED Notes (Signed)
Pt aware of tx plan - will reassess tomorrow. Pt in agreement w/plan. Pt signed Medical Clearance Pt Policy form - voiced understanding. Coffee given w/pt's lunch as requested.

## 2016-10-08 NOTE — ED Notes (Signed)
Pt's eyeglasses on cabinet beside of bed.

## 2016-10-08 NOTE — ED Notes (Signed)
Patient was given a snack and drink, and a regular diet was ordered for Lunch. 
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
## 2016-10-08 NOTE — ED Notes (Signed)
Telepsych beng performed.

## 2016-10-08 NOTE — ED Notes (Signed)
Atarax given as requested for anxiety. Pt appears calm.

## 2016-10-09 DIAGNOSIS — Z87891 Personal history of nicotine dependence: Secondary | ICD-10-CM

## 2016-10-09 DIAGNOSIS — Z9889 Other specified postprocedural states: Secondary | ICD-10-CM

## 2016-10-09 DIAGNOSIS — F315 Bipolar disorder, current episode depressed, severe, with psychotic features: Secondary | ICD-10-CM

## 2016-10-09 DIAGNOSIS — Z818 Family history of other mental and behavioral disorders: Secondary | ICD-10-CM

## 2016-10-09 DIAGNOSIS — Z79899 Other long term (current) drug therapy: Secondary | ICD-10-CM

## 2016-10-09 DIAGNOSIS — Z811 Family history of alcohol abuse and dependence: Secondary | ICD-10-CM | POA: Diagnosis not present

## 2016-10-09 DIAGNOSIS — Z91011 Allergy to milk products: Secondary | ICD-10-CM

## 2016-10-09 DIAGNOSIS — Z888 Allergy status to other drugs, medicaments and biological substances status: Secondary | ICD-10-CM

## 2016-10-09 MED ORDER — ACETAMINOPHEN 500 MG PO TABS
1000.0000 mg | ORAL_TABLET | Freq: Once | ORAL | Status: AC
  Administered 2016-10-09: 1000 mg via ORAL
  Filled 2016-10-09: qty 2

## 2016-10-09 NOTE — ED Notes (Signed)
A regular Diet was Ordered for patient for Lunch, and a Snack and Drink was given at snack time.
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
